# Patient Record
Sex: Male | Born: 1950 | Race: White | Hispanic: No | Marital: Married | Smoking: Never smoker
Health system: Southern US, Community
[De-identification: ages and names within clinical notes are randomized; demographics above are authoritative.]

## PROBLEM LIST (undated history)

## (undated) DIAGNOSIS — R519 Headache, unspecified: Secondary | ICD-10-CM

## (undated) DIAGNOSIS — I10 Essential (primary) hypertension: Secondary | ICD-10-CM

## (undated) DIAGNOSIS — I639 Cerebral infarction, unspecified: Secondary | ICD-10-CM

## (undated) DIAGNOSIS — M1990 Unspecified osteoarthritis, unspecified site: Secondary | ICD-10-CM

## (undated) DIAGNOSIS — M5136 Other intervertebral disc degeneration, lumbar region: Secondary | ICD-10-CM

## (undated) DIAGNOSIS — R51 Headache: Secondary | ICD-10-CM

## (undated) DIAGNOSIS — R011 Cardiac murmur, unspecified: Secondary | ICD-10-CM

## (undated) DIAGNOSIS — K219 Gastro-esophageal reflux disease without esophagitis: Secondary | ICD-10-CM

## (undated) DIAGNOSIS — M51369 Other intervertebral disc degeneration, lumbar region without mention of lumbar back pain or lower extremity pain: Secondary | ICD-10-CM

## (undated) DIAGNOSIS — E785 Hyperlipidemia, unspecified: Secondary | ICD-10-CM

## (undated) HISTORY — PX: ROTATOR CUFF REPAIR: SHX139

## (undated) HISTORY — PX: COLONOSCOPY: SHX174

## (undated) HISTORY — PX: KNEE ARTHROSCOPY: SUR90

---

## 2005-09-12 ENCOUNTER — Emergency Department: Payer: Self-pay | Admitting: General Practice

## 2015-04-19 ENCOUNTER — Other Ambulatory Visit: Payer: Self-pay | Admitting: Specialist

## 2015-04-19 DIAGNOSIS — M7551 Bursitis of right shoulder: Secondary | ICD-10-CM

## 2015-05-11 ENCOUNTER — Ambulatory Visit
Admission: RE | Admit: 2015-05-11 | Discharge: 2015-05-11 | Disposition: A | Discharge status: Home / Self Care | Payer: 59 | Source: Ambulatory Visit | Attending: Specialist | Admitting: Specialist

## 2015-05-11 DIAGNOSIS — M25511 Pain in right shoulder: Secondary | ICD-10-CM | POA: Insufficient documentation

## 2015-05-11 DIAGNOSIS — M75121 Complete rotator cuff tear or rupture of right shoulder, not specified as traumatic: Secondary | ICD-10-CM | POA: Diagnosis not present

## 2015-05-11 DIAGNOSIS — M67813 Other specified disorders of tendon, right shoulder: Secondary | ICD-10-CM | POA: Insufficient documentation

## 2015-05-11 DIAGNOSIS — M7551 Bursitis of right shoulder: Secondary | ICD-10-CM | POA: Diagnosis present

## 2015-05-19 ENCOUNTER — Other Ambulatory Visit: Payer: Self-pay | Admitting: Orthopedic Surgery

## 2015-05-24 ENCOUNTER — Encounter: Payer: Self-pay | Admitting: *Deleted

## 2015-05-24 ENCOUNTER — Other Ambulatory Visit: Payer: 59

## 2015-05-24 NOTE — Patient Instructions (Signed)
  Your procedure is scheduled on: 05-31-15 [facility]) Report to [facility] SAME DAY SURGERY 2ND FLOOR To find out your arrival time please call [phone] between 1PM - 3PM on 05-30-15 (TUESDAY)  Remember: Instructions that are not followed completely may result in serious medical risk, up to and including death, or upon the discretion of your surgeon and anesthesiologist your surgery may need to be rescheduled.    _X___ 1. Do not eat food or drink liquids after midnight. No gum chewing or hard candies.     _X___ 2. No Alcohol for 24 hours before or after surgery.   ____ 3. Bring all medications with you on the day of surgery if instructed.    _X___ 4. Notify your doctor if there is any change in your medical condition     (cold, fever, infections).     Do not wear jewelry, make-up, hairpins, clips or nail polish.  Do not wear lotions, powders, or perfumes. You may wear deodorant.  Do not shave 48 hours prior to surgery. Men may shave face and neck.  Do not bring valuables to the hospital.    [facility] is not responsible for any belongings or valuables.               Contacts, dentures or bridgework may not be worn into surgery.  Leave your suitcase in the car. After surgery it may be brought to your room.  For patients admitted to the hospital, discharge time is determined by your treatment team.   Patients discharged the day of surgery will not be allowed to drive home.   Please read over the following fact sheets that you were given:     _X___ Take these medicines the morning of surgery with A SIP OF WATER:    1. PRAVASTATIN  2. OMEPRAZOLE  3. TAKE AN EXTRA OMEPRAZOLE ON Tuesday NIGHT BEFORE BED  4.  5.  6.  ____ Fleet Enema (as directed)   _X___ Use CHG Soap as directed  ____ Use inhalers on the day of surgery  ____ Stop metformin 2 days prior to surgery    ____ Take 1/2 of usual insulin dose the night before surgery and none on the morning of surgery.   ____  Stop Coumadin/Plavix/aspirin-N/A  ____ Stop Anti-inflammatories-NO NSAIDS OR ASPIRIN PRODUCTS-NORCO OK TO CONTINUE   __X__ Stop supplements until after surgery-STOP GLUCOSAMINE NOW    ____ Bring C-Pap to the hospital.

## 2015-05-25 ENCOUNTER — Encounter
Admission: RE | Admit: 2015-05-25 | Discharge: 2015-05-25 | Disposition: A | Discharge status: Home / Self Care | Payer: 59 | Source: Ambulatory Visit | Attending: Orthopedic Surgery | Admitting: Orthopedic Surgery

## 2015-05-25 DIAGNOSIS — Z01812 Encounter for preprocedural laboratory examination: Secondary | ICD-10-CM | POA: Diagnosis present

## 2015-05-25 DIAGNOSIS — Z01810 Encounter for preprocedural cardiovascular examination: Secondary | ICD-10-CM | POA: Diagnosis present

## 2015-05-25 LAB — CBC WITH DIFFERENTIAL/PLATELET
BASOS PCT: 1 %
Basophils Absolute: 0 10*3/uL (ref 0–0.1)
Eosinophils Absolute: 0.1 10*3/uL (ref 0–0.7)
Eosinophils Relative: 3 %
HEMATOCRIT: 42.7 % (ref 40.0–52.0)
HEMOGLOBIN: 14.5 g/dL (ref 13.0–18.0)
Lymphocytes Relative: 32 %
Lymphs Abs: 1.4 10*3/uL (ref 1.0–3.6)
MCH: 30.7 pg (ref 26.0–34.0)
MCHC: 34 g/dL (ref 32.0–36.0)
MCV: 90.4 fL (ref 80.0–100.0)
MONOS PCT: 8 %
Monocytes Absolute: 0.4 10*3/uL (ref 0.2–1.0)
NEUTROS ABS: 2.5 10*3/uL (ref 1.4–6.5)
NEUTROS PCT: 56 %
Platelets: 176 10*3/uL (ref 150–440)
RBC: 4.72 MIL/uL (ref 4.40–5.90)
RDW: 12.9 % (ref 11.5–14.5)
WBC: 4.5 10*3/uL (ref 3.8–10.6)

## 2015-05-25 LAB — BASIC METABOLIC PANEL
ANION GAP: 7 (ref 5–15)
BUN: 21 mg/dL — ABNORMAL HIGH (ref 6–20)
CALCIUM: 9 mg/dL (ref 8.9–10.3)
CHLORIDE: 104 mmol/L (ref 101–111)
CO2: 28 mmol/L (ref 22–32)
Creatinine, Ser: 0.84 mg/dL (ref 0.61–1.24)
GFR calc non Af Amer: >60 mL/min (ref >60)
Glucose, Bld: 94 mg/dL (ref 65–99)
Potassium: 4.1 mmol/L (ref 3.5–5.1)
SODIUM: 139 mmol/L (ref 135–145)

## 2015-05-25 LAB — PROTIME-INR
INR: 1
PROTHROMBIN TIME: 13.4 s (ref 11.4–15.0)

## 2015-05-25 LAB — APTT: aPTT: 29 seconds (ref 24–36)

## 2015-05-31 ENCOUNTER — Encounter: Payer: Self-pay | Admitting: *Deleted

## 2015-05-31 ENCOUNTER — Encounter
Admission: RE | Disposition: A | Discharge status: Home / Self Care | Payer: Self-pay | Source: Ambulatory Visit | Attending: Orthopedic Surgery

## 2015-05-31 ENCOUNTER — Ambulatory Visit
Admission: RE | Admit: 2015-05-31 | Discharge: 2015-05-31 | Disposition: A | Discharge status: Home / Self Care | Payer: 59 | Source: Ambulatory Visit | Attending: Orthopedic Surgery | Admitting: Orthopedic Surgery

## 2015-05-31 ENCOUNTER — Ambulatory Visit: Payer: 59 | Admitting: Anesthesiology

## 2015-05-31 DIAGNOSIS — M7541 Impingement syndrome of right shoulder: Secondary | ICD-10-CM | POA: Insufficient documentation

## 2015-05-31 DIAGNOSIS — K219 Gastro-esophageal reflux disease without esophagitis: Secondary | ICD-10-CM | POA: Diagnosis not present

## 2015-05-31 DIAGNOSIS — M19011 Primary osteoarthritis, right shoulder: Secondary | ICD-10-CM | POA: Insufficient documentation

## 2015-05-31 DIAGNOSIS — M75121 Complete rotator cuff tear or rupture of right shoulder, not specified as traumatic: Secondary | ICD-10-CM | POA: Diagnosis present

## 2015-05-31 DIAGNOSIS — Z79899 Other long term (current) drug therapy: Secondary | ICD-10-CM | POA: Insufficient documentation

## 2015-05-31 DIAGNOSIS — R011 Cardiac murmur, unspecified: Secondary | ICD-10-CM | POA: Insufficient documentation

## 2015-05-31 DIAGNOSIS — M19049 Primary osteoarthritis, unspecified hand: Secondary | ICD-10-CM | POA: Diagnosis not present

## 2015-05-31 HISTORY — DX: Cardiac murmur, unspecified: R01.1

## 2015-05-31 HISTORY — DX: Gastro-esophageal reflux disease without esophagitis: K21.9

## 2015-05-31 HISTORY — DX: Unspecified osteoarthritis, unspecified site: M19.90

## 2015-05-31 HISTORY — PX: SHOULDER ARTHROSCOPY WITH OPEN ROTATOR CUFF REPAIR: SHX6092

## 2015-05-31 SURGERY — ARTHROSCOPY, SHOULDER WITH REPAIR, ROTATOR CUFF, OPEN
Anesthesia: Regional | Laterality: Right

## 2015-05-31 MED ORDER — CEFAZOLIN SODIUM-DEXTROSE 2-3 GM-% IV SOLR
INTRAVENOUS | Status: AC
  Filled 2015-05-31: qty 50

## 2015-05-31 MED ORDER — NEOMYCIN-POLYMYXIN B GU 40-200000 IR SOLN
Status: AC
  Filled 2015-05-31: qty 2

## 2015-05-31 MED ORDER — BUPIVACAINE HCL (PF) 0.25 % IJ SOLN
INTRAMUSCULAR | Status: AC
  Filled 2015-05-31: qty 30

## 2015-05-31 MED ORDER — LIDOCAINE HCL (PF) 1 % IJ SOLN
INTRAMUSCULAR | Status: AC
  Filled 2015-05-31: qty 30

## 2015-05-31 MED ORDER — MIDAZOLAM HCL 5 MG/5ML IJ SOLN
INTRAMUSCULAR | Status: DC | PRN
  Administered 2015-05-31: 2 mg via INTRAVENOUS

## 2015-05-31 MED ORDER — EPINEPHRINE HCL 1 MG/ML IJ SOLN
INTRAMUSCULAR | Status: AC
  Filled 2015-05-31: qty 1

## 2015-05-31 MED ORDER — LIDOCAINE HCL (CARDIAC) 20 MG/ML IV SOLN
INTRAVENOUS | Status: DC | PRN
  Administered 2015-05-31: 80 mg via INTRAVENOUS

## 2015-05-31 MED ORDER — BUPIVACAINE HCL (PF) 0.5 % IJ SOLN
INTRAMUSCULAR | Status: DC | PRN
  Administered 2015-05-31: 30 mL via PERINEURAL

## 2015-05-31 MED ORDER — ONDANSETRON HCL 4 MG PO TABS: 4.0000 mg | ORAL_TABLET | Freq: Three times a day (TID) | ORAL | Status: DC | PRN

## 2015-05-31 MED ORDER — CHLORHEXIDINE GLUCONATE 4 % EX LIQD
1.0000 "application " | Freq: Once | CUTANEOUS | Status: AC
  Administered 2015-05-31: 1 via TOPICAL

## 2015-05-31 MED ORDER — ONDANSETRON HCL 4 MG/2ML IJ SOLN
INTRAMUSCULAR | Status: DC | PRN
  Administered 2015-05-31: 4 mg via INTRAVENOUS

## 2015-05-31 MED ORDER — EPHEDRINE SULFATE 50 MG/ML IJ SOLN
INTRAMUSCULAR | Status: DC | PRN
  Administered 2015-05-31: 10 mg via INTRAVENOUS

## 2015-05-31 MED ORDER — FENTANYL CITRATE (PF) 100 MCG/2ML IJ SOLN: 25.0000 ug | INTRAMUSCULAR | Status: DC | PRN

## 2015-05-31 MED ORDER — CEFAZOLIN SODIUM-DEXTROSE 2-3 GM-% IV SOLR
2.0000 g | INTRAVENOUS | Status: AC
  Administered 2015-05-31: 2 g via INTRAVENOUS

## 2015-05-31 MED ORDER — ROPIVACAINE HCL 5 MG/ML IJ SOLN
INTRAMUSCULAR | Status: AC
  Filled 2015-05-31: qty 40

## 2015-05-31 MED ORDER — ROCURONIUM BROMIDE 100 MG/10ML IV SOLN
INTRAVENOUS | Status: DC | PRN
  Administered 2015-05-31: 10 mg via INTRAVENOUS

## 2015-05-31 MED ORDER — LACTATED RINGERS IV SOLN
INTRAVENOUS | Status: DC
  Administered 2015-05-31 (×2): via INTRAVENOUS

## 2015-05-31 MED ORDER — OXYCODONE HCL 5 MG PO TABS: 5.0000 mg | ORAL_TABLET | ORAL | Status: DC | PRN

## 2015-05-31 MED ORDER — PROPOFOL 10 MG/ML IV BOLUS
INTRAVENOUS | Status: DC | PRN
  Administered 2015-05-31: 150 mg via INTRAVENOUS

## 2015-05-31 MED ORDER — LIDOCAINE HCL 1 % IJ SOLN
INTRAMUSCULAR | Status: DC | PRN
  Administered 2015-05-31: 10 mL

## 2015-05-31 MED ORDER — FENTANYL CITRATE (PF) 250 MCG/5ML IJ SOLN
INTRAMUSCULAR | Status: DC | PRN
  Administered 2015-05-31 (×4): 50 ug via INTRAVENOUS

## 2015-05-31 MED ORDER — PROMETHAZINE HCL 25 MG/ML IJ SOLN: 6.2500 mg | INTRAMUSCULAR | Status: DC | PRN

## 2015-05-31 MED ORDER — SUCCINYLCHOLINE CHLORIDE 20 MG/ML IJ SOLN
INTRAMUSCULAR | Status: DC | PRN
  Administered 2015-05-31: 100 mg via INTRAVENOUS

## 2015-05-31 MED ORDER — CHLORHEXIDINE GLUCONATE 4 % EX LIQD
1.0000 "application " | Freq: Once | CUTANEOUS | Status: AC
  Administered 2015-05-30: 1 via TOPICAL

## 2015-05-31 MED ORDER — BUPIVACAINE HCL 0.25 % IJ SOLN
INTRAMUSCULAR | Status: DC | PRN
  Administered 2015-05-31: 30 mL

## 2015-05-31 SURGICAL SUPPLY — 64 items
ADAPTER IRRIG TUBE 2 SPIKE SOL (ADAPTER) ×6 IMPLANT
ANCHOR ALL-SUT Q-FIX 2.8 (Anchor) ×6 IMPLANT
BUR RADIUS 4.0X18.5 (BURR) ×3 IMPLANT
BUR RADIUS 5.5 (BURR) ×3 IMPLANT
CANNULA 5.75X7 CRYSTAL CLEAR (CANNULA) ×6 IMPLANT
CANNULA PARTIAL THREAD 2X7 (CANNULA) ×3 IMPLANT
CANNULA TWIST IN 8.25X9CM (CANNULA) ×6 IMPLANT
CLOSURE WOUND 1/2 X4 (GAUZE/BANDAGES/DRESSINGS) ×1
CONNECTOR M SMARTSTITCH (Connector) ×3 IMPLANT
COOLER POLAR GLACIER W/PUMP (MISCELLANEOUS) ×3 IMPLANT
DRAPE IMP U-DRAPE 54X76 (DRAPES) ×6 IMPLANT
DRAPE INCISE IOBAN 66X45 STRL (DRAPES) ×3 IMPLANT
DRAPE U-SHAPE 47X51 STRL (DRAPES) ×3 IMPLANT
DURAPREP 26ML APPLICATOR (WOUND CARE) ×9 IMPLANT
ELECT REM PT RETURN 9FT ADLT (ELECTROSURGICAL) ×3
ELECTRODE REM PT RTRN 9FT ADLT (ELECTROSURGICAL) ×1 IMPLANT
GAUZE PETRO XEROFOAM 1X8 (MISCELLANEOUS) ×3 IMPLANT
GAUZE SPONGE 4X4 12PLY STRL (GAUZE/BANDAGES/DRESSINGS) ×6 IMPLANT
GLOVE BIOGEL PI IND STRL 9 (GLOVE) ×1 IMPLANT
GLOVE BIOGEL PI INDICATOR 9 (GLOVE) ×2
GLOVE SURG 9.0 ORTHO LTXF (GLOVE) ×6 IMPLANT
GOWN STRL REUS TWL 2XL XL LVL4 (GOWN DISPOSABLE) ×3 IMPLANT
GOWN STRL REUS W/ TWL LRG LVL3 (GOWN DISPOSABLE) ×1 IMPLANT
GOWN STRL REUS W/ TWL LRG LVL4 (GOWN DISPOSABLE) ×1 IMPLANT
GOWN STRL REUS W/TWL LRG LVL3 (GOWN DISPOSABLE) ×2
GOWN STRL REUS W/TWL LRG LVL4 (GOWN DISPOSABLE) ×2
IV LACTATED RINGER IRRG 3000ML (IV SOLUTION) ×20
IV LR IRRIG 3000ML ARTHROMATIC (IV SOLUTION) ×10 IMPLANT
KIT RM TURNOVER STRD PROC AR (KITS) ×3 IMPLANT
KIT STABILIZATION SHOULDER (MISCELLANEOUS) ×3 IMPLANT
KIT SUTURE 2.8 Q-FIX DISP (MISCELLANEOUS) ×3 IMPLANT
MANIFOLD NEPTUNE II (INSTRUMENTS) ×3 IMPLANT
MASK FACE SPIDER DISP (MASK) ×3 IMPLANT
MAT BLUE FLOOR 46X72 FLO (MISCELLANEOUS) ×3 IMPLANT
NDL SAFETY 18GX1.5 (NEEDLE) ×3 IMPLANT
NDL SAFETY 22GX1.5 (NEEDLE) ×3 IMPLANT
NS IRRIG 500ML POUR BTL (IV SOLUTION) ×3 IMPLANT
PACK ARTHROSCOPY SHOULDER (MISCELLANEOUS) ×3 IMPLANT
PAD WRAPON POLAR SHDR UNIV (MISCELLANEOUS) ×1 IMPLANT
PASSER SUT CAPTURE FIRST (SUTURE) ×6 IMPLANT
SET TUBE SUCT SHAVER OUTFL 24K (TUBING) ×3 IMPLANT
SET TUBE TIP INTRA-ARTICULAR (MISCELLANEOUS) ×3 IMPLANT
STRIP CLOSURE SKIN 1/2X4 (GAUZE/BANDAGES/DRESSINGS) ×2 IMPLANT
SUT CO BRAID (SUTURE) ×3 IMPLANT
SUT ETHILON 4-0 (SUTURE) ×2
SUT ETHILON 4-0 FS2 18XMFL BLK (SUTURE) ×1
SUT KNTLS 2.8 MAGNUM (Anchor) ×9 IMPLANT
SUT MNCRL 4-0 (SUTURE) ×2
SUT MNCRL 4-0 27XMFL (SUTURE) ×1
SUT PDS AB 0 CT1 27 (SUTURE) ×6 IMPLANT
SUT SMART STITCH CARTRIDGE (SUTURE) ×6 IMPLANT
SUT VIC AB 0 CT1 36 (SUTURE) ×6 IMPLANT
SUT VIC AB 2-0 CT2 27 (SUTURE) ×6 IMPLANT
SUTURE ETHLN 4-0 FS2 18XMF BLK (SUTURE) ×1 IMPLANT
SUTURE MAGNUM WIRE 2X48 BLK (SUTURE) ×6 IMPLANT
SUTURE MNCRL 4-0 27XMF (SUTURE) ×1 IMPLANT
SUTURE OPUS MAGNUM SZ 2 WHT (SUTURE) ×18 IMPLANT
SYRINGE 10CC LL (SYRINGE) ×3 IMPLANT
TAPE MICROFOAM 4IN (TAPE) ×3 IMPLANT
TUBING ARTHRO INFLOW-ONLY STRL (TUBING) ×3 IMPLANT
TUBING CONNECTING 10 (TUBING) ×2 IMPLANT
TUBING CONNECTING 10' (TUBING) ×1
WAND HAND CNTRL MULTIVAC 90 (MISCELLANEOUS) ×3 IMPLANT
WRAPON POLAR PAD SHDR UNIV (MISCELLANEOUS) ×3

## 2015-05-31 NOTE — H&P (Signed)
The patient has been re-examined, and the chart reviewed, and there have been no interval changes to the documented history and physical.    The risks, benefits, and alternatives have been discussed at length, and the patient is willing to proceed.   

## 2015-05-31 NOTE — Transfer of Care (Signed)
Immediate Anesthesia Transfer of Care Note  Patient: Guy Michael  Procedure(s) Performed: Procedure(s): SHOULDER ARTHROSCOPY WITH OPEN ROTATOR CUFF REPAIR (Right)  Patient Location: PACU  Anesthesia Type:GA combined with regional for post-op pain  Level of Consciousness: awake and alert   Airway & Oxygen Therapy: Patient Spontanous Breathing  Post-op Assessment: Report given to RN  Post vital signs: Reviewed  Last Vitals:  Filed Vitals:   05/31/15 1217 05/31/15 1721  BP: 159/93 108/74  Pulse: 71 69  Temp: 36.5 C 36.7 C  Resp: 20 16    Complications: No apparent anesthesia complications

## 2015-05-31 NOTE — Op Note (Signed)
05/31/2015  5:40 PM  PATIENT:  Guy Michael  65 y.o. male  PRE-OPERATIVE DIAGNOSIS:  Complete rotator cuff tear or rupture of right shoulder, acromioclavicular arthritis  POST-OPERATIVE DIAGNOSIS:  Complete rotator cuff tear or rupture of right shoulder, partial biceps tendon tear, shoulder impingement, and acromioclavicular arthritis,   PROCEDURE:  Procedure(s): SHOULDER ARTHROSCOPY WITH OPEN ROTATOR CUFF REPAIR (Right)  SURGEON:  Surgeon(s) and Role:    * Thornton Park, MD - Primary  ANESTHESIA:   local, regional and general   PREOPERATIVE INDICATIONS:  Guy Michael is a  65 y.o. male with a diagnosis of Complete rotator cuff tear or rupture of right shoulder,  who failed conservative measures and elected for surgical management.    The risks benefits and alternatives were discussed with the patient preoperatively including but not limited to the risks of infection, bleeding, nerve injury, persistent pain or weakness, failure of the hardware, re-tear of the rotator cuff and the need for further surgery. Medical risks include DVT and pulmonary embolism, myocardial infarction, stroke, pneumonia, respiratory failure and death. Patient understood these risks and wished to proceed.  OPERATIVE IMPLANTS: ArthroCare Magnum 2 anchors x 3 & Smith [employer]hew Q Fix anchors x 2  OPERATIVE PROCEDURE: The patient was met in the preoperative area. The right shoulder was signed with the word yes and my initials according the hospital's correct site of surgery protocol. The patient is brought to the OR and underwent placement of a interscalene block and general endotracheal intubation by the anesthesia service.  The patient was placed in a beachchair position. A spider arm positioner was used for this case. Examination under anesthesia revealed limitation of motion including forward elevation to 140 and abduction to approximately 100, but no instability with load shift testing. The patient had  a negative sulcus sign.  The patient was prepped and draped in a sterile fashion. A timeout was performed to verify the patient's name, date of birth, medical record number, correct site of surgery and correct procedure to be performed there was also used to verify the patient received antibiotics that all appropriate instruments, implants and radiographs studies were available in the room. Once all in attendance were in agreement case began.  Bony landmarks were drawn out with a surgical marker along with proposed arthroscopy incisions. These were pre-injected with 1% lidocaine plain. An 11 blade was used to establish a posterior portal through which the arthroscope was placed in the glenohumeral joint. A full diagnostic examination of the shoulder was performed. The anterior portal was established under direct visualization with an 18-gauge spinal needle.  A 5.75 mm arthroscopic cannula was placed through the anterior portal.   The intra-articular portion of the biceps tendon was found to have a partial tear involving greater than 50% of the diameter. Therefore the decision was made to perform a tenotomy. An arthroscopic scissor was used to release the biceps tendon off the superior labrum. The arthroscopic shaver was then used to debride the frayed edges of the labrum. There were no anterior or superior labral tears seen.  Subscapularis tendon was intact. Patient had a full-thickness tear involving the supraspinatus with retraction to the top of the humeral head.. There were no loose bodies within the inferior recess and no evidence of HAGL lesion.  The arthroscope was then placed in the subacromial space. A lateral portal was then established using an 18-gauge spinal needle for localization.  Two ArthroCare Smart stitches were placed along the lateral border of the torn rotator cuff  under direct visualization. The greater tuberosity was debrided using a 4.0 mm resector shaver blade to remove all  remaining fibers of the rotator cuff.  Debridement was performed until punctate bleeding was seen at the greater tuberosity footprint, which will allow for rotator cuff healing.  Extensive bursitis was encountered and debrided using a 4-0 resector shaver blade and a 90 ArthroCare wand from the lateral portal. A subacromial decompression was also performed using a 5.5 mm resector shaver blade from the lateral portal and a distal clavicle excision was performed through the anterior portal also with the 5.5 mm resector All arthroscopic instruments were then removed after lavaging the subacromial space of all bone debris and the mini-open portion of the procedure began.   A saber-type incision was made along the lateral border of the acromion. The deltoid muscle was identified and split in line with its fibers which allowed visualization of the rotator cuff. The Smart stitches previously placed in the lateral border of the rotator cuff werealso brought out through the deltoid split. Two Q-Fix anchors were then placed at the articular margin of the humeral head and greater tuberosity. The four suture limbs of both Q Fix anchors were passed medially through the rotator cuff using a first pass suture passer. The Smart stitches from the lateral border of the rotator cuff were then anchored to the greater tuberosity of the humeral head using three Magnum 2 anchors. These anchors were tensioned to allow for anatomic reduction of the rotator cuff to the greater tuberosity footprint. The medial row repair was then completed using an arthroscopic knot tying technique with the Q fix anchor sutures. Once all sutures were tied down, arthroscopic images of the double row repair were taken with the arthroscope both externally and arthroscopically fromthe glenohumeral joint.  All incisions were copiously irrigated. The deltoid fascia was repaired using a 0 Vicryl suture in an interrupted fashion. The subcutaneous  tissue of all incisions were closed with a 2-0 Vicryl. Skin closure for the arthroscopic incisions was performed with 4-0 nylon. The skin edges of the saber incision were approximated with a running 4-0 undyed Monocryl. A dry sterile dressing was applied. The patient was placed in an abduction sling, with a Polar Care sleeve, and TENS unit leads were applied.  .  All sharp and instrument counts were correct at the conclusion of the case. I was scrubbed and present for the entire case. I spoke with the patient's wife in the postoperative consultation room to let them know the case had been performed without complication and the patient was stable in recovery room.     Timoteo Gaul, MD

## 2015-05-31 NOTE — Anesthesia Postprocedure Evaluation (Signed)
Anesthesia Post Note  Patient: Guy Michael  Procedure(s) Performed: Procedure(s) (LRB): SHOULDER ARTHROSCOPY WITH OPEN ROTATOR CUFF REPAIR (Right)  Patient location during evaluation: PACU Anesthesia Type: General Level of consciousness: awake Pain management: satisfactory to patient Vital Signs Assessment: post-procedure vital signs reviewed and stable Respiratory status: nonlabored ventilation Cardiovascular status: stable Anesthetic complications: no    Last Vitals:  Filed Vitals:   05/31/15 1217 05/31/15 1721  BP: 159/93 108/74  Pulse: 71 69  Temp: 36.5 C 36.7 C  Resp: 20 16    Last Pain:  Filed Vitals:   05/31/15 1729  PainSc: Asleep                 VAN STAVEREN,Wilkie Zenon

## 2015-05-31 NOTE — Anesthesia Procedure Notes (Addendum)
Anesthesia Regional Block:  Interscalene brachial plexus block  Pre-Anesthetic Checklist: ,, timeout performed, Correct Patient, Correct Site, Correct Laterality, Correct Procedure, Correct Position, site marked, Risks and benefits discussed,  Surgical consent,  Pre-op evaluation,  At surgeon's request and post-op pain management  Laterality: Right and Upper  Prep: chloraprep       Needles:  Injection technique: Single-shot  Needle Type: Stimiplex     Needle Length: 13cm 13 cm Needle Gauge: 22 and 22 G    Additional Needles:  Procedures: ultrasound guided (picture in chart) Interscalene brachial plexus block Narrative:  Start time: 05/31/2015 1:55 PM End time: 05/31/2015 2:00 PM Injection made incrementally with aspirations every 5 mL.  Performed by: Personally  Anesthesiologist: Lenard Simmer   Procedure Name: Intubation Date/Time: 05/31/2015 2:04 PM Performed by: Chong Sicilian Pre-anesthesia Checklist: Patient identified, Emergency Drugs available, Suction available, Patient being monitored and Timeout performed Patient Re-evaluated:Patient Re-evaluated prior to inductionOxygen Delivery Method: Circle system utilized Preoxygenation: Pre-oxygenation with 100% oxygen Intubation Type: IV induction Ventilation: Mask ventilation without difficulty Laryngoscope Size: Miller and 2 Grade View: Grade I Tube type: Oral Number of attempts: 1 Airway Equipment and Method: Stylet Placement Confirmation: ETT inserted through vocal cords under direct vision,  positive ETCO2 and breath sounds checked- equal and bilateral Secured at: 21 cm Tube secured with: Tape Dental Injury: Teeth and Oropharynx as per pre-operative assessment

## 2015-05-31 NOTE — Discharge Instructions (Signed)
AMBULATORY SURGERY  °DISCHARGE INSTRUCTIONS ° ° °1) The drugs that you were given will stay in your system until tomorrow so for the next 24 hours you should not: ° °A) Drive an automobile °B) Make any legal decisions °C) Drink any alcoholic beverage ° ° °2) You may resume regular meals tomorrow.  Today it is better to start with liquids and gradually work up to solid foods. ° °You may eat anything you prefer, but it is better to start with liquids, then soup and crackers, and gradually work up to solid foods. ° ° °3) Please notify your doctor immediately if you have any unusual bleeding, trouble breathing, redness and pain at the surgery site, drainage, fever, or pain not relieved by medication. ° °4) Your post-operative visit with Dr.                     °           °     is: Date:                        Time:   ° °Please call to schedule your post-operative visit. ° °5) Additional Instructions: °6)  °

## 2015-05-31 NOTE — Anesthesia Preprocedure Evaluation (Signed)
Anesthesia Evaluation  Patient identified by MRN, date of birth, ID band Patient awake    Reviewed: Allergy & Precautions, H&P , NPO status , Patient's Chart, lab work & pertinent test results, reviewed documented beta blocker date and time   History of Anesthesia Complications Negative for: history of anesthetic complications  Airway Mallampati: II  TM Distance: >3 FB Neck ROM: full    Dental no notable dental hx. (+) Teeth Intact   Pulmonary neg pulmonary ROS,    Pulmonary exam normal breath sounds clear to auscultation       Cardiovascular Exercise Tolerance: Good (-) angina(-) CAD, (-) Past MI, (-) Cardiac Stents and (-) CABG Normal cardiovascular exam(-) dysrhythmias + Valvular Problems/Murmurs (as a child)  Rhythm:regular Rate:Normal     Neuro/Psych negative neurological ROS  negative psych ROS   GI/Hepatic Neg liver ROS, GERD  Medicated,  Endo/Other  negative endocrine ROS  Renal/GU negative Renal ROS  negative genitourinary   Musculoskeletal   Abdominal   Peds  Hematology negative hematology ROS (+)   Anesthesia Other Findings Past Medical History:   Heart murmur                                                   Comment:as a child   GERD (gastroesophageal reflux disease)                       Arthritis                                                    Reproductive/Obstetrics negative OB ROS                             Anesthesia Physical Anesthesia Plan  ASA: II  Anesthesia Plan: General   Post-op Pain Management:    Induction:   Airway Management Planned:   Additional Equipment:   Intra-op Plan:   Post-operative Plan:   Informed Consent: I have reviewed the patients History and Physical, chart, labs and discussed the procedure including the risks, benefits and alternatives for the proposed anesthesia with the patient or authorized representative who has indicated  his/her understanding and acceptance.   Dental Advisory Given  Plan Discussed with: Anesthesiologist, CRNA and Surgeon  Anesthesia Plan Comments:         Anesthesia Quick Evaluation

## 2015-06-01 ENCOUNTER — Encounter: Payer: Self-pay | Admitting: Orthopedic Surgery

## 2015-08-25 DIAGNOSIS — I639 Cerebral infarction, unspecified: Secondary | ICD-10-CM

## 2015-08-25 HISTORY — DX: Cerebral infarction, unspecified: I63.9

## 2015-09-11 ENCOUNTER — Encounter: Payer: Self-pay | Admitting: Physical Therapy

## 2015-09-11 ENCOUNTER — Ambulatory Visit: Payer: 59 | Attending: Neurology | Admitting: Physical Therapy

## 2015-09-11 DIAGNOSIS — R2681 Unsteadiness on feet: Secondary | ICD-10-CM | POA: Diagnosis present

## 2015-09-11 DIAGNOSIS — R42 Dizziness and giddiness: Secondary | ICD-10-CM | POA: Diagnosis present

## 2015-09-11 DIAGNOSIS — M6281 Muscle weakness (generalized): Secondary | ICD-10-CM | POA: Insufficient documentation

## 2015-09-11 DIAGNOSIS — R262 Difficulty in walking, not elsewhere classified: Secondary | ICD-10-CM | POA: Insufficient documentation

## 2015-09-11 NOTE — Therapy (Signed)
[place] [facility] [address] , [place], [address] Phone: [phone]   Fax:  [phone]  Physical Therapy Evaluation  Patient Details  Name: Ardith DarkCharles Aicher MRN: [id number] Date of Birth: 11/19/1950 Referring Provider: Morene CrockerPOTTER, ZACHARY E  Encounter Date: 09/11/2015      PT End of Session - 09/11/15 1745    Visit Number 1   Number of Visits 17   Date for PT Re-Evaluation 11/06/15   Authorization Type g codes   PT Start Time 0445   PT Stop Time 0545   PT Time Calculation (min) 60 min   Equipment Utilized During Treatment Gait belt   Behavior [facility] for tasks assessed/performed      Past Medical History  Diagnosis Date  . Heart murmur     as a child  . GERD (gastroesophageal reflux disease)   . Arthritis     Past Surgical History  Procedure Laterality Date  . Rotator cuff repair    . Knee arthroscopy    . Colonoscopy    . Shoulder arthroscopy with open rotator cuff repair Right 05/31/2015    Procedure: SHOULDER ARTHROSCOPY WITH OPEN ROTATOR CUFF REPAIR;  Surgeon: Juanell FairlyKevin Krasinski, MD;  Location: [facility] ORS;  Service: Orthopedics;  Laterality: Right;    There were no vitals filed for this visit.       Subjective Assessment - 09/11/15 1654    Subjective patient has dizziness and double vision.    Pertinent History head ache, rotator cuff repair BUE,  DDD of lumbar spine, GERD, hyperlipidemai   Patient Stated Goals to not be dizzy   Currently in Pain? No/denies            [facility] Assessment - 09/11/15 0001    Assessment   Medical Diagnosis cva   Referring Provider Theora MasterOTTER, ZACHARY E   Onset Date/Surgical Date 08/25/15   Hand Dominance Right   Next MD Visit 11/25/15   Prior Therapy --  rotator cuff surgery and therapy for right shoulder   Precautions   Precautions None   Restrictions   Weight Bearing Restrictions No   Balance Screen   Has the patient fallen in the past 6 months No   Has the patient had a decrease in activity level because of a fear of falling?  Yes   Is the patient reluctant to leave their home because of a fear of falling?  No   Home [employer] information [employer] managernvironment   Living Environment Private residence   Living Arrangements Spouse/significant other   Available Help at Discharge Family   Type of Home House   Home Access Stairs to enter   Entrance Stairs-Number of Steps 6   Entrance Stairs-Rails Right;Left;Can reach both   Home Layout One level   Home Equipment [place] - single point   Prior Function   Level of Independence Independent   Vocation Retired       PAIN: no reports of pain  POSTURE: WFL, left facial droop   PROM/AROM: Decreased cervical ROM extension/rotation left and right and SB left and right   STRENGTH:  Graded on a 0-5 scale Muscle Group Left Right  Shoulder flex    Shoulder Abd    Shoulder Ext    Shoulder IR/ER    Elbow    Wrist/hand    Hip Flex 5 5  Hip Abd 5 5  Hip Add 5 5  Hip Ext 5 5  Hip IR/ER 5 5  Knee Flex 5 5  Knee Ext 5 5  Ankle DF 5 5  Ankle PF 5 5   SENSATION: numbness in left side of chin to left ear   SPECIAL TESTS:  Negative  Dix hall pike test  Left   FUNCTIONAL MOBILITY: independent   BALANCE: fair dynamic standing balance    GAIT: Patient ambulates with deviation in path without AD  OUTCOME MEASURES: TEST Outcome Interpretation  5 times sit<>stand 12 sec >67 yo, >15 sec indicates increased risk for falls  10 meter walk test  1.15               m/s <1.0 m/s indicates increased risk for falls; limited community ambulator  Timed up and Go     11.35            sec <14 sec indicates increased risk for falls  6 minute walk test                Feet 1000 feet is community ambulator                                 PT Education - 10-05-2015 1702    Education provided Yes   Education Details plan of care   Person(s) Educated Patient   Methods Explanation   Comprehension Verbalized  understanding             PT Long Term Goals - 2015/10/05 1754    PT LONG TERM GOAL #1   Title Patient will be independent in home exercise program to improve strength/mobility for better functional independence with mobility.   Time 8   Period Weeks   Status New   PT LONG TERM GOAL #2   Title Patient will decrease his dizziness during ambulation and mobility  to 25% of the current amount of dizziness.    Time 8   Period Weeks   Status New               Plan - 10-05-2015 1746    Clinical Impression Statement Patient has complaints of double vision and dizziness and spinning sensation. 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 He has decreased gait speed and decreased 6 MW test. Patient has decreased cervical ROM flex/ext/rotation and SB. Patient has negative dix-hall pike  on left side. Patient has head ache with walking that goes away after ambulation. Patient has deviated  path during ambulation .   Rehab Potential Fair   Clinical Impairments Affecting Rehab Potential decreased ROM cervical spine    PT Frequency 2x / week   PT Duration 8 weeks   PT Treatment/Interventions Balance training;Therapeutic activities;Neuromuscular re-education;Gait training   PT Next Visit Plan vestibular testing   Consulted and Agree with Plan of Care Family member/caregiver;Patient      Patient will benefit from skilled therapeutic intervention in order to improve the following deficits and impairments:  Difficulty walking, Decreased balance, Decreased activity tolerance, Decreased endurance, Abnormal gait  Visit Diagnosis: Muscle weakness (generalized)  Difficulty in walking, not elsewhere classified      G-Codes - 2015/10/05 1807    Functional Assessment Tool Used 5 x sit to stand, TUG, 10 MW, 6 MW   Functional Limitation Mobility: Walking and moving around   Mobility: Walking and Moving Around Current Status (Z6109) At least 1 percent but less than 20 percent impaired, limited or restricted    Mobility: Walking and Moving Around Goal Status (U0454) 0 percent impaired, limited  or restricted       Problem List There are no active problems to display for this patient.  Ezekiel Ina, PT, DPT [place], [place] 09/11/2015, 6:08 PM  [place] [facility] MAIN [facility] SERVICES [address] [place], [place], [address] Phone: [phone]   Fax:  [phone]  Name: Duwan Adrian MRN: [id number] Date of Birth: 30-Mar-1951

## 2015-09-12 ENCOUNTER — Encounter: Payer: Self-pay | Admitting: Physical Therapy

## 2015-09-12 ENCOUNTER — Ambulatory Visit: Payer: 59 | Admitting: Physical Therapy

## 2015-09-12 DIAGNOSIS — R2681 Unsteadiness on feet: Secondary | ICD-10-CM

## 2015-09-12 DIAGNOSIS — R262 Difficulty in walking, not elsewhere classified: Secondary | ICD-10-CM

## 2015-09-12 DIAGNOSIS — R42 Dizziness and giddiness: Secondary | ICD-10-CM

## 2015-09-12 DIAGNOSIS — M6281 Muscle weakness (generalized): Secondary | ICD-10-CM | POA: Diagnosis not present

## 2015-09-12 NOTE — Therapy (Signed)
[place] [facility] MAIN [facility] SERVICES [address] [place], [place], [address] Phone: [phone]   Fax:  [phone]  Physical Therapy Treatment  Patient Details  Name: Guy Michael MRN: [id number] Date of Birth: 1950-08-25 Referring Provider: Morene Crocker  Encounter Date: 09/12/2015      PT End of Session - 09/12/15 1414    Visit Number 2   Number of Visits 17   Date for PT Re-Evaluation 2015/12/06   Authorization Type g codes   PT Start Time 1306   PT Stop Time 1405   PT Time Calculation (min) 59 min   Equipment Utilized During Treatment Gait belt   Activity Tolerance Patient tolerated treatment well   Behavior During Therapy [facility] for tasks assessed/performed      Past Medical History  Diagnosis Date  . Heart murmur     as a child  . GERD (gastroesophageal reflux disease)   . Arthritis     Past Surgical History  Procedure Laterality Date  . Rotator cuff repair    . Knee arthroscopy    . Colonoscopy    . Shoulder arthroscopy with open rotator cuff repair Right 05/31/2015    Procedure: SHOULDER ARTHROSCOPY WITH OPEN ROTATOR CUFF REPAIR;  Surgeon: Juanell Fairly, MD;  Location: [facility] ORS;  Service: Orthopedics;  Laterality: Right;    There were no vitals filed for this visit.      Subjective Assessment - 09/12/15 1412    Subjective Patient reports that his dizziness is worse this afternoon and reports 8/10 dizziness at arrival.    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 Per MR, pt woke up and had severe dizziness, "fell to his left" and had  nausea. Pt had diplopia. Pt went to the ER for care.  Subjective history of current problem: Pt reports he was not having any dizziness symptoms prior to the vertebral artery dissection and stroke. Pt reports he is having dizziness all the time now. Pt reports numbness external left ear and "sensation of blockage- like ears need to pop" on the left ear. Pt reports that he has stopped taking the Meclizine and is only taking the Valium for the last few days. Patient states he felt the Valium helps his symptoms a little bit.  Pt states his symptoms are worse in the am and as the day progresses feels his dizziness improves. Patient has an eye patch to wear due to double vision.  Description of dizziness: vertigo, unsteadiness, lightheadedness, left aural fullness Frequency: daily  Duration: continuous Symptom nature: constant   Provocative Factors: riding in a car Easing Factors: planting his feet wide apart helps with the balance only, nothing helps with the dizziness  Progression of symptoms: better decreased nausea, decreased double vision and dizziness symptoms since onset History of similar episodes: no  Falls (yes/no): no Number of falls in past 6 months: none  Prior Functional Level: independent community ambulator, driving, lawn mowing business, independent with ADLs.   Auditory complaints (tinnitus, pain, drainage): sensation of aural fullness in left ear only denies  other  Vision (last eye exam, diplopia, recent changes): diplopia. and blurry vision  Current Symptoms: vertigo, nausea at times,lightheadedness, general unsteadiness, imbalance, veering, dizzy, oscillopsia   EXAMINATION      COORDINATION: Finger to Nose:  Normal Past Pointing:  Left     MUSCULOSKELETAL SCREEN: Cervical Spine ROM: Decreased cervical AROM L/ R rotation noted    Functional Mobility: independent transfers  Gait: patient ambulates with decreased cadence with decreased arm swing bilaterally with wide  BOS and demonstrates uneven steppage and decrease in gait speed with turning and with ambulation with head turns.  Scanning of visual environment with gait is: fair  Balance: Pt demonstrates difficulty with ambulation with head turns and turning. Pt demonstrates difficulty with narrow BOS, EC and uneven surfaces.   POSTURAL CONTROL TESTS:   Clinical Test of Sensory Interaction for Balance    (CTSIB):  CONDITION TIME STRATEGY SWAY  Eyes open, firm surface              30 sec ankle +1  Eyes closed, firm surface 20 sec  +4  Eyes open, foam surface 30 sec Ankle and hip +2  Eyes closed, foam surface 5 sec   +4    OCULOMOTOR / VESTIBULAR TESTING:  Oculomotor Exam- Room Light  Normal Abnormal Comments  Ocular Alignment  Abn Right pupil is lower than the left pupil   Ocular ROM N    Spontaneous Nystagmus N    End-Gaze Nystagmus   Right end gaze nystagmyus noted greater than left  Smooth Pursuit N    Saccades N    VOR N    VOR Cancellation N    Left Head Thrust   deferred  Right Head Thrust   deferred  Head Shaking Nystagmus   deferred   Reports left gaze vision makes his vision blurry and right gaze vision stays in focus.   FUNCTIONAL OUTCOME MEASURES:  Results Comments  DHI 64 Moderate perception of handicap; in need of intervention  ABC Scale 66.25% Falls risk; in need of intervention  DGI 19/24 Falls risk; in need of intervention        [facility] PT Assessment - 09/12/15 1334    Standardized Balance Assessment   Standardized Balance Assessment Dynamic Gait Index   Dynamic Gait Index   Level Surface Mild Impairment   Change in Gait Speed Normal   Gait with Horizontal Head Turns Moderate Impairment   Gait with Vertical Head Turns Moderate Impairment   Gait and Pivot Turn Normal   Step Over Obstacle Normal   Step Around Obstacles Normal   Steps Normal   Total Score 19      Neuromuscular Re-education:  On firm surface and Airex pad: On firm surface and then on Airex  pad, performed feet together and semi-tandem progressions with alternate lead leg with and without horiz and vert head turns with CGA. On firm surface and then on Airex pad, performed feet together and semi-tandem progressions with alternate lead leg with and without body turns with CGA. Pt with hip strategy and increase of LOB with activities on Airex pad.      PT Education - 09/12/15 1414    Education provided Yes   Education Details Issued and reviewed as to HEP including safety precautions.    Person(s) Educated Patient;Spouse   Methods Explanation;Demonstration;Handout;Verbal cues   Comprehension Verbalized understanding;Returned demonstration;Verbal cues required             PT Long Term Goals - 09/11/15 1754  PT LONG TERM GOAL #1   Title Patient will be independent in home exercise program to improve strength/mobility for better functional independence with mobility.   Time 8   Period Weeks   Status New   PT LONG TERM GOAL #2   Title Patient will decrease his dizziness during ambulation and mobility  to 25% of the current amount of dizziness.    Time 8   Period Weeks   Status New               Plan - 09/12/15 1415    Rehab Potential Fair   Clinical Impairments Affecting Rehab Potential decreased ROM cervical spine; Positive indicators: motivated, family support   Negative Indicators: vertebral artery dissection with stroke    PT Frequency 2x / week   PT Duration 8 weeks   PT Treatment/Interventions Balance training;Therapeutic activities;Neuromuscular re-education;Gait training;Patient/family education;Vestibular   PT Next Visit Plan Review HEP and added additional balance and vestibular exercises   PT Home Exercise Plan feet together and semi-tandem progressions with and without body turns and head turns on firm and foam,    Consulted and Agree with Plan of Care Family member/caregiver;Patient      Patient will benefit from skilled therapeutic  intervention in order to improve the following deficits and impairments:  Difficulty walking, Decreased balance, Decreased activity tolerance, Decreased endurance, Abnormal gait, Decreased coordination, Dizziness  Visit Diagnosis: Dizziness and giddiness  Difficulty in walking, not elsewhere classified  Unsteadiness on feet       G-Codes - 09/11/15 1807    Functional Assessment Tool Used 5 x sit to stand, TUG, 10 MW, 6 MW   Functional Limitation Mobility: Walking and moving around   Mobility: Walking and Moving Around Current Status [phone]([id number]) At least 1 percent but less than 20 percent impaired, limited or restricted   Mobility: Walking and Moving Around Goal Status [phone](G8979) 0 percent impaired, limited or restricted      Problem List There are no active problems to display for this patient.  Mardelle Matteorriea Nikesha Kwasny PT, DPT Mardelle MatteMurphy,Jhamir Pickup 09/12/2015, 2:20 PM  [place] [facility] [address] [place], [place], [address] Phone: [phone]   Fax:  [phone]  Name: Guy Michael MRN: [id number] Date of Birth: 05/14/1950

## 2015-09-14 ENCOUNTER — Ambulatory Visit: Payer: 59 | Admitting: Physical Therapy

## 2015-09-19 ENCOUNTER — Encounter: Payer: Self-pay | Admitting: Physical Therapy

## 2015-09-19 ENCOUNTER — Ambulatory Visit: Payer: 59 | Admitting: Physical Therapy

## 2015-09-19 DIAGNOSIS — R262 Difficulty in walking, not elsewhere classified: Secondary | ICD-10-CM

## 2015-09-19 DIAGNOSIS — M6281 Muscle weakness (generalized): Secondary | ICD-10-CM | POA: Diagnosis not present

## 2015-09-19 DIAGNOSIS — R42 Dizziness and giddiness: Secondary | ICD-10-CM

## 2015-09-19 NOTE — Therapy (Signed)
[place] [facility] MAIN [facility] SERVICES [address] [place], [place], [address] Phone: [phone]   Fax:  [phone]  Physical Therapy Treatment  Patient Details  Name: Guy Michael MRN: [id number] Date of Birth: 1950-12-17 Referring Provider: Morene Crocker  Encounter Date: 09/19/2015      PT End of Session - 09/19/15 1352    Visit Number 3   Number of Visits 17   Date for PT Re-Evaluation 11-17-15   Authorization Type g codes   PT Start Time 1350   PT Stop Time 1434   PT Time Calculation (min) 44 min   Equipment Utilized During Treatment Gait belt   Activity Tolerance Patient tolerated treatment well   Behavior During Therapy [facility] for tasks assessed/performed      Past Medical History  Diagnosis Date  . Heart murmur     as a child  . GERD (gastroesophageal reflux disease)   . Arthritis     Past Surgical History  Procedure Laterality Date  . Rotator cuff repair    . Knee arthroscopy    . Colonoscopy    . Shoulder arthroscopy with open rotator cuff repair Right 05/31/2015    Procedure: SHOULDER ARTHROSCOPY WITH OPEN ROTATOR CUFF REPAIR;  Surgeon: Juanell Fairly, MD;  Location: [facility] ORS;  Service: Orthopedics;  Laterality: Right;    There were no vitals filed for this visit.      Subjective Assessment - 09/19/15 1354    Subjective Pt  reports that he continues to have markedly increased dizziness when in the car and states he keeps his eyes closed when riding in cars. Patient reports that he has been trying to do his exercises twice a day and states that he is also trying do his arm exercises. Pt states he is having good and bad days in regards to his dizziness and imbalance symptoms. Pt states he is having 8/10 dizziness on arrival this date. Patient reports that during the session nothing increased his dizziness levels and that if anything doing the activities was decreasing some of his dizziness while doing the activity.      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 Per MR, pt woke up and had severe dizziness, "fell to his left" and had nausea. Pt had diplopia. Pt went to the ER for care.    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 --  none stated     Neuromuscular Re-education:  VOR exercise: Demonstrated and discussed VOR x1 exercise. In sitting, pt performed VOR X 1 horiz 1 rep of 30 seconds and then 1 rep of 1 minute. Pt reported no increase in dizziness (patient arrived to clinic with reports of 8/10 dizziness at rest this date), but states he felt like he felt a headache might be starting.   On firm surface: On firm surface performed static SLS stance holds with patient requiring 2 finger support of one hand to be able to balance longer than 2-3 seconds per leg and performed slow marching with 3-5 second holds with pt touching with few fingers of one hand for support and intermittently being able to lift up fingers for a few seconds. Patient had difficulty performing SLS activities and required CGA. Patient tends to lose his  balance to the left side.   Walking with head turns: Performed multiple 16' trials each of forwards and retro ambulation with and without vert and horiz head turns with CGA with good cadence and noted mild veering and uneven steps at times.   Quick Turns:  Performed 8 reps walking 8' with alternating quick turns L/R. Patient did well with this activity and had only one small LOB with a quick turn that he was able to self-correct.    Newman Pies toss to self: Performed static ball toss to self horiz and vert while turning head and eyes to follow ball. Then, worked on 30' trials of forward ambulation while doing ball toss to self vert and  horiz.  Hallway ball toss: In hallway, worked on ball toss against one wall with alternating quick turns to toss ball against opposite wall. Patient did well with this activity with no LOB and denied any increase in his dizziness levels with this activity.         PT Education - 09/19/15 1534    Education provided Yes   Education Details Added additional exercises to HEP; demonstrated and then patient returned demonstration of standing in corner for safety with HEP   Person(s) Educated Patient;Spouse   Methods Explanation;Demonstration;Handout   Comprehension Verbalized understanding;Returned demonstration             PT Long Term Goals - 09/12/15 1425    PT LONG TERM GOAL #1   Title Patient will be independent in home exercise program to improve strength/mobility for better functional independence with mobility.   Time 8   Period Weeks   Status New   PT LONG TERM GOAL #2   Title Patient will decrease his dizziness during ambulation and mobility  to 25% of the current amount of dizziness.    Baseline Reports 8/10 dizziness on 09/12/2015   Time 8   Period Weeks   Status New   PT LONG TERM GOAL #3   Title Patient will demonstrate reduced falls risk as evidenced by Dynamic Gait Index (DGI) 21/24 or greater.   Baseline scored 19/24 on 09/12/2015   Time 8   Period Weeks   Status New   PT LONG TERM GOAL #4   Title Patient will reduce perceived disability to low levels as indicated by <40 on Dizziness Handicap Inventory.   Baseline scored 64 moderate on DHI on 09/12/2015   Time 8   Period Weeks   Status New   PT LONG TERM GOAL #5   Title Patient will have demonstrate decreased falls risk as indicated by Activities Specific Balance Confidence Scale score of 80% or greater.   Baseline scored 66.25% on 09/12/2015   Time 8   Period Weeks   Status New               Plan - 09/19/15 1539    Clinical Impression Statement Patient demonstrates improvements with ambulation  with quick turns this date but continues to demonstrate mild veering and some uneven steppage with ambulation with head turns. Pt has marked difficulty with SLS stance activities and tends to lose his balance to his left side. Pt requiring a few fingers support of one hand in order to maintain SLS for greater than 3-5 seconds. Patient would benefit from continued PT services to work on balance and dizziness symptoms and to work towards goals as set on updated POC.   Rehab Potential Fair   Clinical Impairments Affecting Rehab Potential decreased ROM cervical spine; Positive  indicators: motivated, family support   Negative Indicators: vertebral artery dissection with stroke    PT Frequency 2x / week   PT Duration 8 weeks   PT Treatment/Interventions Balance training;Therapeutic activities;Neuromuscular re-education;Gait training;Patient/family education;Vestibular   PT Next Visit Plan Review HEP; consider working on ball circles, rockerboard sways.   PT Home Exercise Plan feet together and semi-tandem progressions with and without body turns and head turns on firm and foam; VOR, slow marching, SLS, amb with head turns, amb with quick turns;    Consulted and Agree with Plan of Care Family member/caregiver;Patient      Patient will benefit from skilled therapeutic intervention in order to improve the following deficits and impairments:  Difficulty walking, Decreased balance, Decreased activity tolerance, Decreased endurance, Abnormal gait, Decreased coordination, Dizziness  Visit Diagnosis: Dizziness and giddiness  Difficulty in walking, not elsewhere classified     Problem List There are no active problems to display for this patient.  Mardelle Matteorriea Murphy PT, DPT Mardelle MatteMurphy,Dorriea 09/19/2015, 3:56 PM  [place] [facility] [address] [place], [place], [address] Phone: [phone]   Fax:  [phone]  Name: Ardith DarkCharles Michael MRN: [id number] Date  of Birth: 01/03/1951

## 2015-09-20 ENCOUNTER — Ambulatory Visit: Payer: 59 | Admitting: Physical Therapy

## 2015-09-22 ENCOUNTER — Ambulatory Visit: Payer: 59 | Attending: Neurology | Admitting: Physical Therapy

## 2015-09-22 ENCOUNTER — Encounter: Payer: Self-pay | Admitting: Physical Therapy

## 2015-09-22 DIAGNOSIS — R262 Difficulty in walking, not elsewhere classified: Secondary | ICD-10-CM | POA: Diagnosis present

## 2015-09-22 DIAGNOSIS — R2681 Unsteadiness on feet: Secondary | ICD-10-CM | POA: Diagnosis present

## 2015-09-22 DIAGNOSIS — R42 Dizziness and giddiness: Secondary | ICD-10-CM | POA: Insufficient documentation

## 2015-09-22 DIAGNOSIS — M6281 Muscle weakness (generalized): Secondary | ICD-10-CM | POA: Insufficient documentation

## 2015-09-22 NOTE — Therapy (Signed)
[facility] [address] [place], [place], [address] Phone: [phone]   Fax:  [phone]  Physical Therapy Treatment  Patient Details  Name: Guy Michael MRN: [id number] Date of Birth: 03/07/1951 Referring Provider: Morene CrockerPOTTER, ZACHARY E  Encounter Date: 09/22/2015      PT End of Session - 09/22/15 1036    Visit Number 4   Number of Visits 17   Date for PT Re-Evaluation 11/06/15   Authorization Type g codes   PT Start Time 1031   PT Stop Time 1116   PT Time Calculation (min) 45 min   Equipment Utilized During Treatment Gait belt   Activity Tolerance Patient tolerated treatment well   Behavior [facility] for tasks assessed/performed      Past Medical History  Diagnosis Date  . Heart murmur     as a child  . GERD (gastroesophageal reflux disease)   . Arthritis     Past Surgical History  Procedure Laterality Date  . Rotator cuff repair    . Knee arthroscopy    . Colonoscopy    . Shoulder arthroscopy with open rotator cuff repair Right 05/31/2015    Procedure: SHOULDER ARTHROSCOPY WITH OPEN ROTATOR CUFF REPAIR;  Surgeon: Juanell FairlyKevin Krasinski, MD;  Location: [facility] ORS;  Service: Orthopedics;  Laterality: Right;    There were no vitals filed for this visit.      Subjective Assessment - 09/22/15 1035    Subjective Pt states he is still having up and down days and reports 8/10 dizziness on arrival this date. Pt states that now at night he is able to get up night by himself.    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 Per MR, pt woke up and had severe dizziness, "fell to his left" and had nausea. Pt had diplopia. Pt went to the ER for care.    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 Pt required verbal cues for amount of head turn excursion and to try to turn head smoothly side to side. Demonstrated and patient returned demonstrated chin tuck technique for VOR exercise.   Alphabet: Performed while in sitting drawing alphabet letters with left foot.  Rockerboard: On small wooden rocker board, worked on Home DepotSS and A/P sways with and without horiz head turns. On medium wooden rocker board, worked on Starbucks CorporationSS sways without horiz head turns.  Ball circles: Worked on standing with one foot on ball while doing CW rolls multiple reps (about 15) each foot with faded UEs support. Performed standing on firm surface and then repeated standing on Airex pad.  Step Taps: Pt performed alternate foot tapping on Airex pad about 20 reps.  Ther-ex: Quantum leg press 120# x 10, 130# 2 x 10;       PT Education - 09/22/15 1036    Education provided Yes   Education Details Added exercises to AT&THEP   Person(s) Educated Patient   Methods Explanation;Handout   Comprehension Verbalized understanding;Returned demonstration;Verbal cues required             PT Long Term Goals - 09/12/15  1425    PT LONG TERM GOAL #1   Title Patient will be independent in home exercise program to improve strength/mobility for better functional independence with mobility.   Time 8   Period Weeks   Status New   PT LONG TERM GOAL #2   Title Patient will decrease his dizziness during ambulation and mobility  to 25% of the current amount of dizziness.    Baseline Reports 8/10 dizziness on 09/12/2015   Time 8   Period Weeks   Status New   PT LONG TERM GOAL #3   Title Patient will demonstrate reduced falls risk as evidenced by Dynamic Gait Index (DGI) 21/24 or greater.   Baseline scored 19/24 on 09/12/2015   Time 8   Period Weeks   Status New   PT LONG TERM GOAL #4    Title Patient will reduce perceived disability to low levels as indicated by <40 on Dizziness Handicap Inventory.   Baseline scored 64 moderate on DHI on 09/12/2015   Time 8   Period Weeks   Status New   PT LONG TERM GOAL #5   Title Patient will have demonstrate decreased falls risk as indicated by Activities Specific Balance Confidence Scale score of 80% or greater.   Baseline scored 66.25% on 09/12/2015   Time 8   Period Weeks   Status New          Plan: Assessment: Pt reports good compliance with HEP. Pt continues to have difficulty with SLS especially when standing on left leg, uneven surfaces and pt tends to lose his balance posterolaterally to the left when balance is challenged. Encouraged patient to follow-up as indicated.      Plan - 09/22/15 1037    Rehab Potential Fair   Clinical Impairments Affecting Rehab Potential decreased ROM cervical spine; Positive indicators: motivated, family support   Negative Indicators: vertebral artery dissection with stroke    PT Frequency 2x / week   PT Duration 8 weeks   PT Treatment/Interventions Balance training;Therapeutic activities;Neuromuscular re-education;Gait training;Patient/family education;Vestibular   PT Next Visit Plan Continue working on ball circles with progressions, rockerboard sways, cone tapping, [employer] tower walking   PT Home Exercise Plan feet together and semi-tandem progressions with and without body turns and head turns on firm and foam; VOR, slow marching, SLS, amb with head turns, amb with quick turns;    Consulted and Agree with Plan of Care Family member/caregiver;Patient      Patient will benefit from skilled therapeutic intervention in order to improve the following deficits and impairments:  Difficulty walking, Decreased balance, Decreased activity tolerance, Decreased endurance, Abnormal gait, Decreased coordination, Dizziness  Visit Diagnosis: Dizziness and giddiness  Difficulty in walking, not elsewhere  classified     Problem List There are no active problems to display for this patient.  Mardelle Matte PT, DPT Murphy,Dorriea 09/22/2015, 4:40 PM  [place] [facility] MAIN [facility] SERVICES [address] [place], [place], [address] Phone: [phone]   Fax:  [phone]  Name: Guy Michael MRN: [id number] Date of Birth: [birth year]

## 2015-09-26 ENCOUNTER — Ambulatory Visit: Payer: 59 | Admitting: Physical Therapy

## 2015-09-27 ENCOUNTER — Ambulatory Visit: Payer: 59

## 2015-09-27 VITALS — BP 147/90 | HR 60

## 2015-09-27 DIAGNOSIS — R42 Dizziness and giddiness: Secondary | ICD-10-CM

## 2015-09-27 DIAGNOSIS — R2681 Unsteadiness on feet: Secondary | ICD-10-CM

## 2015-09-27 DIAGNOSIS — R262 Difficulty in walking, not elsewhere classified: Secondary | ICD-10-CM

## 2015-09-27 NOTE — Therapy (Signed)
[facility] MAIN [facility] SERVICES [address] [place], [place], [address] Phone: [phone]   Fax:  [phone]  Physical Therapy Treatment  Patient Details  Name: Guy Michael MRN: [id number] Date of Birth: 12-Feb-1951 Referring Provider: Morene Crocker  Encounter Date: 09/27/2015      PT End of Session - 09/27/15 1613    Visit Number 5   Number of Visits 17   Date for PT Re-Evaluation 11/21/2015   Authorization Type g codes   PT Start Time 0953   PT Stop Time 1040   PT Time Calculation (min) 47 min   Equipment Utilized During Treatment Gait belt   Activity Tolerance Patient tolerated treatment well   Behavior During Therapy [facility] for tasks assessed/performed      Past Medical History  Diagnosis Date  . Heart murmur     as a child  . GERD (gastroesophageal reflux disease)   . Arthritis     Past Surgical History  Procedure Laterality Date  . Rotator cuff repair    . Knee arthroscopy    . Colonoscopy    . Shoulder arthroscopy with open rotator cuff repair Right 05/31/2015    Procedure: SHOULDER ARTHROSCOPY WITH OPEN ROTATOR CUFF REPAIR;  Surgeon: Juanell Fairly, MD;  Location: [facility] ORS;  Service: Orthopedics;  Laterality: Right;    Filed Vitals:   09/27/15 0957  BP: 147/90  Pulse: 60  SpO2: 98%        Subjective Assessment - 09/27/15 0954    Subjective Pt reports 5/10 dizziness upon arrival on this date. He denies pain currently. He reports that exercises are going well at home. Pt appears frustrated by lack of improvement in dizziness. However he does state that it has improved since it first started. He has also noted improvement in his balance.    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 Per MR, pt woke up and had severe dizziness, "fell to his left" and had nausea. Pt had  diplopia. Pt went to the ER for care.    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 No/denies      TREATMENT  Neuromuscular Re-education: VOR exercise: In feet together standing on Airex pad, pt performed VOR X 1 horiz x 1 minute, pt with increased sway on Airex. Cues for proper speed with head turns. Denies worsening dizziness with exercises; VOR x 1 horizontal with forward/retro ambulation 35' x 3;  Step and Cone taps: Pt performed alternate foot tapping on Airex pad about 10 reps/leg; On Airex pad/ foam surface, patient performed left and right foot tapping to cone targets in series of one, two, and three cones, multiple reps each leg. Pt pressing arms against // bars for balance at times.  Eyes Closed Practiced feet apart and feet together balance on Airex with eyes open and then closed in each condition, 30 seconds each;  Tandem Gait Performed tandem gait in // bars x 4 lengths with intermittent min/modA+1 and pressing arms against // bars for balance;  Sidestepping Performed side stepping in // bars x 2 lengths; Side stepping with horizontal head turns x 2 lengths; Side stepping on Airex balance beam with horizontal head turns x 4 lengths;  Hallway walking Forward/backwards ambulation in hallway  with head turns to read playing cards 75' x 2 each; 180 degree quick turns alternating L and R in 10' segments, multiple repetitions  Ball circles: Worked on standing on Airex with one foot on ball while doing CW/CCW rolls multiple reps each foot without UE support;                          PT Education - 09/27/15 1612    Education provided Yes   Education Details Reinforced HEP, will progress at next visit   Person(s) Educated Patient   Methods Explanation   Comprehension Verbalized understanding             PT Long Term Goals - 09/12/15 1425    PT  LONG TERM GOAL #1   Title Patient will be independent in home exercise program to improve strength/mobility for better functional independence with mobility.   Time 8   Period Weeks   Status New   PT LONG TERM GOAL #2   Title Patient will decrease his dizziness during ambulation and mobility  to 25% of the current amount of dizziness.    Baseline Reports 8/10 dizziness on 09/12/2015   Time 8   Period Weeks   Status New   PT LONG TERM GOAL #3   Title Patient will demonstrate reduced falls risk as evidenced by Dynamic Gait Index (DGI) 21/24 or greater.   Baseline scored 19/24 on 09/12/2015   Time 8   Period Weeks   Status New   PT LONG TERM GOAL #4   Title Patient will reduce perceived disability to low levels as indicated by <40 on Dizziness Handicap Inventory.   Baseline scored 64 moderate on DHI on 09/12/2015   Time 8   Period Weeks   Status New   PT LONG TERM GOAL #5   Title Patient will have demonstrate decreased falls risk as indicated by Activities Specific Balance Confidence Scale score of 80% or greater.   Baseline scored 66.25% on 09/12/2015   Time 8   Period Weeks   Status New               Plan - 09/27/15 1616    Clinical Impression Statement Pt demonstrates decreased balance on Airex with feet together and eyes closed (condition 4 mCTSIB). He consistently falls to the L after 3-5 seconds. Pt requiring multiple steps to regain balance with quick turns in hallway but gradually improves with repetition. Still has notable L horizontal nystagmus with mid range L gaze. Pt provided encouragement regarding progress since starting therapy. Discussed prognosis and importance of agressive therapy within the first 6 months following a neurological insult. Encouraged to continue HEP. Will progress exercises at next visit if indicated.    Rehab Potential Fair   Clinical Impairments Affecting Rehab Potential decreased ROM cervical spine; Positive indicators: motivated, family  support   Negative Indicators: vertebral artery dissection with stroke    PT Frequency 2x / week   PT Duration 8 weeks   PT Treatment/Interventions Balance training;Therapeutic activities;Neuromuscular re-education;Gait training;Patient/family education;Vestibular   PT Next Visit Plan Review HEP; rockerboard sways, head turns with ambulation, eyes closed marching on firm   PT Home Exercise Plan feet together and semi-tandem progressions with and without body turns and head turns on firm and foam; VOR, slow marching, SLS, amb with head turns, amb with quick turns;    Consulted and Agree with Plan of Care Family member/caregiver;Patient      Patient  will benefit from skilled therapeutic intervention in order to improve the following deficits and impairments:  Difficulty walking, Decreased balance, Decreased activity tolerance, Decreased endurance, Abnormal gait, Decreased coordination, Dizziness  Visit Diagnosis: Dizziness and giddiness  Difficulty in walking, not elsewhere classified  Unsteadiness on feet     Problem List There are no active problems to display for this patient.  Lynnea MaizesJason D Huprich PT, DPT   Huprich,Jason 09/27/2015, 4:20 PM  [facility] [address] [place], [place], [address] Phone: [phone]   Fax:  [phone]  Name: Guy Michael MRN: [id number] Date of Birth: 01/29/1951

## 2015-09-29 ENCOUNTER — Ambulatory Visit: Payer: 59 | Admitting: Physical Therapy

## 2015-10-02 ENCOUNTER — Ambulatory Visit: Payer: 59 | Admitting: Physical Therapy

## 2015-10-04 ENCOUNTER — Encounter: Payer: Self-pay | Admitting: Physical Therapy

## 2015-10-04 ENCOUNTER — Ambulatory Visit: Payer: 59 | Admitting: Physical Therapy

## 2015-10-04 DIAGNOSIS — R42 Dizziness and giddiness: Secondary | ICD-10-CM

## 2015-10-04 DIAGNOSIS — R262 Difficulty in walking, not elsewhere classified: Secondary | ICD-10-CM

## 2015-10-04 DIAGNOSIS — R2681 Unsteadiness on feet: Secondary | ICD-10-CM

## 2015-10-04 NOTE — Therapy (Signed)
[facility] [facility] [address] [place], [place], [address] Phone: [phone]   Fax:  [phone]  Physical Therapy Treatment  Patient Details  Name: Guy Michael MRN: [id number] Date of Birth: 01/05/1951 Referring Provider: Morene CrockerPOTTER, ZACHARY E  Encounter Date: 10/04/2015      PT End of Session - 10/04/15 1409    Visit Number 6   Number of Visits 17   Date for PT Re-Evaluation 11/06/15   Authorization Type g codes   PT Start Time 1306   PT Stop Time 1352   PT Time Calculation (min) 46 min   Equipment Utilized During Treatment Gait belt   Activity Tolerance Patient tolerated treatment well   Behavior [facility] for tasks assessed/performed      Past Medical History  Diagnosis Date  . Heart murmur     as a child  . GERD (gastroesophageal reflux disease)   . Arthritis     Past Surgical History  Procedure Laterality Date  . Rotator cuff repair    . Knee arthroscopy    . Colonoscopy    . Shoulder arthroscopy with open rotator cuff repair Right 05/31/2015    Procedure: SHOULDER ARTHROSCOPY WITH OPEN ROTATOR CUFF REPAIR;  Surgeon: Juanell FairlyKevin Krasinski, MD;  Location: [facility] ORS;  Service: Orthopedics;  Laterality: Right;    There were no vitals filed for this visit.      Subjective Assessment - 10/04/15 1308    Subjective Patient states he does not feel his dizziness has changed much. Pt states that he felts he does feel that after doing his exercises he feels his balance and dizziness are a little better. Pt states he has been doing his exercises twice a day. Pt states he has been walking more and walked a mile yesterday without difficulty.    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 Per MR, pt woke up and had severe dizziness, "fell to his left" and had nausea. Pt had diplopia.  Pt went to the ER for care.    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   VOR X2: Demonstrated and discussed VOR X 2 exercise. Patient performed VOR X 2 horiz in sitting 3 reps of 1 minute each. Pt reported increased dizziness a rated 8/10. Pt demonstrated good technique.  Active eye movement between 2 Targets: Discussed and demonstrated active eye movements between 2 targets exercise. Pt performed active eye movements between two targets horiz and vert 2 reps of 60 seconds each with good technique. Performed in sitting visual scanning of letters on mirror to spell words.  On firm surface: On firm surface performed feet together EC trials and then attempted EC with slow marching but this was too difficult therefore tried EC SLS on firm surface and patient was able to hold for 2-4 seconds with reaching for // bars at times due to overt LOB.    Newman PiesBall toss over shoulder: Pt performed multiple 1775' trials of forward and retro ambulation while tossing ball over one shoulder with return catch over opposite shoulder at shoulder level and then with varying the ball position to head, shoulder and waist level to promote head turning  and tilting with CGA. Patient reports that this recreates and increased his dizziness levels. Patient with good pace with activity but noted several small LOBs and missteps.   Bounce Passes: Performed ambulation 73' trials while doing alternating sides bounce passes to self with ball while tracking ball with eyes and head.         PT Education - 10/04/15 1408    Education provided Yes   Education Details Added additional exercises to HEP and reviewed with patient   Person(s) Educated Patient;Other (comment)  friend   Methods Explanation;Demonstration;Handout;Verbal cues    Comprehension Verbalized understanding;Returned demonstration             PT Long Term Goals - 09/12/15 1425    PT LONG TERM GOAL #1   Title Patient will be independent in home exercise program to improve strength/mobility for better functional independence with mobility.   Time 8   Period Weeks   Status New   PT LONG TERM GOAL #2   Title Patient will decrease his dizziness during ambulation and mobility  to 25% of the current amount of dizziness.    Baseline Reports 8/10 dizziness on 09/12/2015   Time 8   Period Weeks   Status New   PT LONG TERM GOAL #3   Title Patient will demonstrate reduced falls risk as evidenced by Dynamic Gait Index (DGI) 21/24 or greater.   Baseline scored 19/24 on 09/12/2015   Time 8   Period Weeks   Status New   PT LONG TERM GOAL #4   Title Patient will reduce perceived disability to low levels as indicated by <40 on Dizziness Handicap Inventory.   Baseline scored 64 moderate on DHI on 09/12/2015   Time 8   Period Weeks   Status New   PT LONG TERM GOAL #5   Title Patient will have demonstrate decreased falls risk as indicated by Activities Specific Balance Confidence Scale score of 80% or greater.   Baseline scored 66.25% on 09/12/2015   Time 8   Period Weeks   Status New               Plan - 10/04/15 1410    Clinical Impression Statement Patient reports good compliance with home exercise program and is demonstrating improvements with balance activities in clinic such as EC feet together on firm. Patient continues to struggle with EC, SLS and narrow BOS activities and in general losses his balance to the left consistently when challenged. Patient reported that VOR X 2 exercise brought on an in increase in his dizziness rating and therefore added this as well as active eye movements between two targets to HEP. Will consider doing activities on the Dentist.    Rehab Potential Fair   Clinical Impairments Affecting Rehab Potential  decreased ROM cervical spine; Positive indicators: motivated, family support   Negative Indicators: vertebral artery dissection with stroke    PT Frequency 2x / week   PT Duration 8 weeks   PT Treatment/Interventions Balance training;Therapeutic activities;Neuromuscular re-education;Gait training;Patient/family education;Vestibular   PT Next Visit Plan Head turns with ambulation activities, ambulation with ball toss over shoulder forward and retro, consider trying Balance Master machine for limits of stability sways and rhythmic weight shift.    PT Home Exercise Plan feet together and semi-tandem progressions with and without body turns and head turns on firm and foam; VOR, slow marching, SLS, amb with head turns, amb with quick turns;    Consulted and Agree with Plan of Care  Family member/caregiver;Patient      Patient will benefit from skilled therapeutic intervention in order to improve the following deficits and impairments:  Difficulty walking, Decreased balance, Decreased activity tolerance, Decreased endurance, Abnormal gait, Decreased coordination, Dizziness  Visit Diagnosis: Dizziness and giddiness  Difficulty in walking, not elsewhere classified  Unsteadiness on feet     Problem List There are no active problems to display for this patient.  Mardelle Matte PT, DPT Mardelle Matte 10/04/2015, 2:16 PM  [place] [facility] MAIN [facility] SERVICES [address] [place], [place], [address] Phone: [phone]   Fax:  [phone]  Name: Judy Goodenow MRN: [id number] Date of Birth: June 04, 1950

## 2015-10-05 ENCOUNTER — Ambulatory Visit: Payer: 59 | Admitting: Physical Therapy

## 2015-10-06 ENCOUNTER — Encounter: Payer: Self-pay | Admitting: Physical Therapy

## 2015-10-06 ENCOUNTER — Ambulatory Visit: Payer: 59 | Admitting: Physical Therapy

## 2015-10-06 DIAGNOSIS — R262 Difficulty in walking, not elsewhere classified: Secondary | ICD-10-CM

## 2015-10-06 DIAGNOSIS — R42 Dizziness and giddiness: Secondary | ICD-10-CM

## 2015-10-06 NOTE — Therapy (Signed)
[place] MAIN [facility] SERVICES [address] [place], [place], [address] Phone: [phone]   Fax:  [phone]  Physical Therapy Treatment  Patient Details  Name: Guy Michael MRN: [id number] Date of Birth: [birth year] Referring Provider: Morene Crocker  Encounter Date: 10/06/2015      PT End of Session - 10/06/15 0907    Visit Number 7   Number of Visits 17   Date for PT Re-Evaluation 2015-11-07   Authorization Type g codes   PT Start Time 0825   PT Stop Time 0912   PT Time Calculation (min) 47 min   Equipment Utilized During Treatment Gait belt   Activity Tolerance Patient tolerated treatment well   Behavior During Therapy [facility] for tasks assessed/performed      Past Medical History  Diagnosis Date  . Heart murmur     as a child  . GERD (gastroesophageal reflux disease)   . Arthritis     Past Surgical History  Procedure Laterality Date  . Rotator cuff repair    . Knee arthroscopy    . Colonoscopy    . Shoulder arthroscopy with open rotator cuff repair Right 05/31/2015    Procedure: SHOULDER ARTHROSCOPY WITH OPEN ROTATOR CUFF REPAIR;  Surgeon: Juanell Fairly, MD;  Location: [facility] ORS;  Service: Orthopedics;  Laterality: Right;    There were no vitals filed for this visit.      Subjective Assessment - 10/06/15 0827    Subjective Patient reports 8/10 dizziness on arrival. Pt states he has been doing the new exercises and those have been going all right. Pt reports increase in headaches in the past few days. Pt states he was having headaches initially in the hospital but states it had gotten better.    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 Per MR, pt woke up and had severe dizziness, "fell to his left" and had nausea. Pt had diplopia. Pt went to the ER for care.    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 Yes   Pain Location Head   Pain Orientation Posterior      Neuromuscular Re-education: Patient arrives to clinic with complaints of 8/10 dizziness symptoms and of headache.    VOR X2: Patient performed VOR X 2 horiz in sitting 1 rep and in standing 2 reps of 1 minute each. Pt reported increase in dizziness with this activity. Pt demonstrated required verbal cues for technique as initially patient was moving target too far into peripheral field and pt was not tracking target with his eyes.     Balance Master Machine: Performed all conditions and 3 trails each of the sensory organization test. Patient challenged by sway referenced support activities with EO, EC and with EO combined with sway referenced surround. Pt with persistent left lateral weight shift lean at rest and with activities.  Performed rhythmic weight shifts side to side and ant/posterior at 1,2, and 3 sec intervals multiple reps. Pt tended to overshoot sways to the left and undershoot sways to the right.  Performed limits of stability sways multiple reps. Pt demonstrated the most difficulty with sways to the right, posterolateral to the right and anterolateral to the right. Pt did improve with practice.  Pt required sitting  rest break after this activity secondary to headache and dizziness symptoms.       PT Education - 10/06/15 0907    Education provided Yes   Education Details Discussed Barrister's clerkBalance Master machine and testing results reviewed with patient.    Person(s) Educated Patient   Methods Explanation;Demonstration   Comprehension Verbalized understanding             PT Long Term Goals - 10/06/15 0926    PT LONG TERM GOAL #1   Title Patient will be independent in home exercise program to improve strength/mobility for better functional independence with mobility.   Time 8   Period  Weeks   Status Achieved   PT LONG TERM GOAL #2   Title Patient will decrease his dizziness during ambulation and mobility  to 25% of the current amount of dizziness.    Baseline Reports 8/10 dizziness on 09/12/2015   Time 8   Period Weeks   Status On-going   PT LONG TERM GOAL #3   Title Patient will demonstrate reduced falls risk as evidenced by Dynamic Gait Index (DGI) 21/24 or greater.   Baseline scored 19/24 on 09/12/2015   Time 8   Period Weeks   Status On-going   PT LONG TERM GOAL #4   Title Patient will reduce perceived disability to low levels as indicated by <40 on Dizziness Handicap Inventory.   Baseline scored 64 moderate on DHI on 09/12/2015   Time 8   Period Weeks   Status On-going   PT LONG TERM GOAL #5   Title Patient will have demonstrate decreased falls risk as indicated by Activities Specific Balance Confidence Scale score of 80% or greater.   Baseline scored 66.25% on 09/12/2015   Time 8   Period Weeks   Status On-going               Plan - 10/06/15 [id number]    Clinical Impression Statement Patient arrives to clinic with report of increased dizziness and headache this date. Pt continues to have reporduction of his dizziness symptoms with VOR X 2 exercise and was able to progress to standing. Patient improved with practice trials of sensory organization testing on the Balance Master machine. Pt consistently throughout testing demonstrated a proclivity to weight shift to left. Pt had the most difficulty with sway reference surface with EO and EC and sway referenced vision and surface categories. Patient would benefit from continued PT services to continue to try to address functional deficits including balance and subjective symptoms of dizziness and imbalance.     Rehab Potential Fair   Clinical Impairments Affecting Rehab Potential decreased ROM cervical spine; Positive indicators: motivated, family support   Negative Indicators: vertebral artery dissection with  stroke    PT Frequency 2x / week   PT Duration 8 weeks   PT Treatment/Interventions Balance training;Therapeutic activities;Neuromuscular re-education;Gait training;Patient/family education;Vestibular   PT Next Visit Plan Consider trying low light environment amb with ball toss over shoulder forward and retro at varying heights and activities on Airex balance beam. Consider placing left foot on ball while doing UEs activities working on decreasing reliance on left weight shift position. Consider progressing VOR X 2 with conflicting background next visit. In addition, will need to repeat functional outcome measures in the next visit or two.    PT Home Exercise Plan feet together and semi-tandem progressions with and without body turns and head turns on firm and foam; VOR, slow marching, SLS, amb with head turns, amb with  quick turns;    Consulted and Agree with Plan of Care Family member/caregiver;Patient      Patient will benefit from skilled therapeutic intervention in order to improve the following deficits and impairments:  Difficulty walking, Decreased balance, Decreased activity tolerance, Decreased endurance, Abnormal gait, Decreased coordination, Dizziness  Visit Diagnosis: Dizziness and giddiness  Difficulty in walking, not elsewhere classified     Problem List There are no active problems to display for this patient.  Mardelle Matte PT, DPT  Priyal Musquiz 10/06/2015, 9:33 AM  [place] [facility] MAIN [facility] SERVICES [address] [place], [place], [address] Phone: [phone]   Fax:  [phone]  Name: Guy Michael MRN: [id number] Date of Birth: Aug 02, 1950

## 2015-10-09 ENCOUNTER — Ambulatory Visit: Payer: 59 | Admitting: Physical Therapy

## 2015-10-10 ENCOUNTER — Encounter: Payer: 59 | Admitting: Physical Therapy

## 2015-10-11 ENCOUNTER — Ambulatory Visit: Payer: 59 | Admitting: Physical Therapy

## 2015-10-11 ENCOUNTER — Encounter: Payer: Self-pay | Admitting: Physical Therapy

## 2015-10-11 DIAGNOSIS — R262 Difficulty in walking, not elsewhere classified: Secondary | ICD-10-CM

## 2015-10-11 DIAGNOSIS — R42 Dizziness and giddiness: Secondary | ICD-10-CM | POA: Diagnosis not present

## 2015-10-11 NOTE — Therapy (Signed)
[place] MAIN [facility] SERVICES [address] [place], [place], [address] Phone: [phone]   Fax:  [phone]  Physical Therapy Treatment  Patient Details  Name: Guy Michael MRN: [id number] Date of Birth: Feb 21, 1951 Referring Provider: Anabel Bene  Encounter Date: 10/11/2015      PT End of Session - 10/11/15 1234    Visit Number 8   Number of Visits 17   Date for PT Re-Evaluation 11/19/2015   Authorization Type g codes   PT Start Time 0300   PT Stop Time 1226   PT Time Calculation (min) 60 min   Equipment Utilized During Treatment Gait belt   Activity Tolerance Patient tolerated treatment well   Behavior During Therapy [facility] for tasks assessed/performed      Past Medical History  Diagnosis Date  . Heart murmur     as a child  . GERD (gastroesophageal reflux disease)   . Arthritis     Past Surgical History  Procedure Laterality Date  . Rotator cuff repair    . Knee arthroscopy    . Colonoscopy    . Shoulder arthroscopy with open rotator cuff repair Right 05/31/2015    Procedure: SHOULDER ARTHROSCOPY WITH OPEN ROTATOR CUFF REPAIR;  Surgeon: Thornton Park, MD;  Location: [facility] ORS;  Service: Orthopedics;  Laterality: Right;    There were no vitals filed for this visit.      Subjective Assessment - 10/11/15 1126    Subjective Pt reports 3/10 headache on arrival and patient states he has been having a lot of headaches recently. Pt reports that he has been having chronic near daily headache issues since prior to the vertebral artery dissection and stroke.  Pt states he started taking a low dose of a new blood pressure medication for the past 2 days. Patient unable to recall the name of the blood pressure medication and will try to bring that information the next session so that medications can be updated in the EMR. Pt reports his dizziness was better yesterday and he was able to do yard work and prune trees. Pt states "I felt  great yesterday". Pt states today he woke up dizzy however.    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 Per MR, pt woke up and had severe dizziness, "fell to his left" and had nausea. Pt had diplopia. Pt went to the ER for care.    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 Discussed test results and compared to prior testing.  Clinical Test of Sensory Interaction for Balance    (CTSIB):  CONDITION TIME STRATEGY SWAY  Eyes open, firm surface 30 seconds ankle +1  Eyes closed, firm surface 30 seconds Ankle, hip +2  Eyes open, foam surface 30 seconds Ankle +2  Eyes closed, foam surface 5 seconds  +4     Neuromuscular Re-education:   VOR X2: Patient performed VOR X 2 horiz with conflicting background in standing 3 reps of 1 minute each. Pt required vc to not move target into his peripheral vision. Pt reports 5/10 dizziness with this activity.   Ball circles: Performed standing on right leg, while lightly placing other left foot on  soccer ball while doing tossing balloon with UEs against mirror. Performed multiple sets of this exercise.         PT Education - 10/11/15 1233    Education provided Yes   Education Details Discussed progress towards goals. Discussed test results and compared to prior testing. Discussed VOR X 2 and active eye movement between two targets progression of conflicting background   Person(s) Educated Patient   Methods Demonstration;Explanation;Verbal cues   Comprehension Verbalized understanding;Returned demonstration             PT Long Term Goals - 10/11/15 1235    PT LONG TERM GOAL #1   Title Patient will be independent in home exercise  program to improve strength/mobility for better functional independence with mobility.   Time 8   Period Weeks   Status Achieved   PT LONG TERM GOAL #2   Title Patient will decrease his dizziness during ambulation and mobility  to 25% of the current amount of dizziness.    Baseline Reports 8/10 dizziness on 09/12/2015   Time 8   Period Weeks   Status On-going   PT LONG TERM GOAL #3   Title Patient will demonstrate reduced falls risk as evidenced by Dynamic Gait Index (DGI) 21/24 or greater.   Baseline scored 19/24 on 09/12/2015; scored 22/24 on 10/11/2015   Time 8   Period Weeks   Status Achieved   PT LONG TERM GOAL #4   Title Patient will reduce perceived disability to low levels as indicated by <40 on Dizziness Handicap Inventory.   Baseline scored 64 moderate on [place] on 09/12/2015; scored 56 moderate on [place] on 10/11/2015   Time 8   Period Weeks   Status Partially Met   PT LONG TERM GOAL #5   Title Patient will have demonstrate decreased falls risk as indicated by Activities Specific Balance Confidence Scale score of 80% or greater.   Baseline scored 66.25% on 09/12/2015; scored 48.8 % on 10/10/2105   Time 8   Period Weeks   Status On-going               Plan - 10/11/15 1206    Clinical Impression Statement Retested functional outcome measures this date and patient is demonstrating progress towards goals and has met goals of independence with HEP and DGI score of 21/24 or greater. Pt scored 19/24 on teh DGI at initial evaluation and scored 22/24 this date. Pt's ABC scale went from 66% to 48% and DHI scored decreased from 64 to 56 which remains in the moderate perception of handicap level and is in need of intervention. Pt's TUG scored decresaed from 11.3 sec to 7.5 sec which indicates decreased falls risk. Pt  with CTSIB testing originally was only able to hold EC on firm for 20 seconds and EC on foam for 5 seconds and this date was able to hold for 30 seconds with +2 sway EC on firm  surface and on foam surface with EC held 8 seconds. 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 Patient reports he continues to have daily dizziness but reports he is now having some days in which his symptoms are noticably decreased compared to other days. Patient would benefit from continued PT services to try to further address goals, to try to decrease patient's subjective symptoms of dizziness, to improve patient's balance and to decrease falls risk.   Rehab Potential Fair   Clinical Impairments Affecting Rehab Potential decreased ROM cervical spine; Positive indicators: motivated,  family support   Negative Indicators: vertebral artery dissection with stroke    PT Frequency 2x / week   PT Duration 8 weeks   PT Treatment/Interventions Balance training;Therapeutic activities;Neuromuscular re-education;Gait training;Patient/family education;Vestibular   PT Next Visit Plan Consider trying low light environment amb with ball toss over shoulder forward and retro at varying heights and activities on Airex balance beam. Continue working on placing left foot on ball while doing UEs activities working on decreasing reliance on left weight shift position. Work on ambulation in [employer] with searching for targets and with head turns.    PT Home Exercise Plan feet together and semi-tandem progressions with and without body turns and head turns on firm and foam; VOR, slow marching, SLS, amb with head turns, amb with quick turns; active eye movements between 2 targets and VOR X 2 with conflicting background   Consulted and Agree with Plan of Care Family member/caregiver;Patient      Patient will benefit from skilled therapeutic intervention in order to improve the following deficits and impairments:  Difficulty walking, Decreased balance, Decreased activity tolerance, Decreased endurance, Abnormal gait, Decreased coordination, Dizziness  Visit Diagnosis: Dizziness and  giddiness  Difficulty in walking, not elsewhere classified     Problem List There are no active problems to display for this patient.  Lady Deutscher PT, DPT Murphy,Dorriea 10/11/2015, 2:00 PM  [place] MAIN [facility] SERVICES [address] [place], [place], [address] Phone: [phone]   Fax:  [phone]  Name: Guy Michael MRN: [id number] Date of Birth: 01/05/1951

## 2015-10-13 ENCOUNTER — Encounter: Payer: Self-pay | Admitting: Physical Therapy

## 2015-10-13 ENCOUNTER — Ambulatory Visit: Payer: 59 | Admitting: Physical Therapy

## 2015-10-13 DIAGNOSIS — R42 Dizziness and giddiness: Secondary | ICD-10-CM

## 2015-10-13 DIAGNOSIS — R262 Difficulty in walking, not elsewhere classified: Secondary | ICD-10-CM

## 2015-10-13 NOTE — Therapy (Signed)
[place] MAIN [facility] SERVICES [address] [place], [place], [address] Phone: [phone]   Fax:  [phone]  Physical Therapy Treatment  Patient Details  Name: Guy Michael MRN: [id number] Date of Birth: 03-Jan-1951 Referring Provider: Anabel Bene  Encounter Date: 10/13/2015      PT End of Session - 10/13/15 1623    Visit Number 9   Number of Visits 17   Date for PT Re-Evaluation 11/12/2015   Authorization Type g codes   PT Start Time 06/23/1034   PT Stop Time 1124   PT Time Calculation (min) 48 min   Equipment Utilized During Treatment Gait belt   Activity Tolerance Patient tolerated treatment well   Behavior During Therapy [facility] for tasks assessed/performed      Past Medical History  Diagnosis Date  . Heart murmur     as a child  . GERD (gastroesophageal reflux disease)   . Arthritis     Past Surgical History  Procedure Laterality Date  . Rotator cuff repair    . Knee arthroscopy    . Colonoscopy    . Shoulder arthroscopy with open rotator cuff repair Right 05/31/2015    Procedure: SHOULDER ARTHROSCOPY WITH OPEN ROTATOR CUFF REPAIR;  Surgeon: Thornton Park, MD;  Location: [facility] ORS;  Service: Orthopedics;  Laterality: Right;    There were no vitals filed for this visit.      Subjective Assessment - 10/13/15 1618    Subjective Patient reports that he was able to do tree pruning and yard work yesterday.    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 Per MR, pt woke up and had severe dizziness, "fell to his left" and had nausea. Pt had diplopia. Pt went to the ER for care.    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   Walking while scanning for visual targets: Performed ambulation in busy environment (medical mall) in busy visual environment (medical mall) while scanning for visual targets in hallway as called out by therapist and then repeated with patient performing random head turns horiz, vert and [place] toss over shoulder: Pt performed multiple 85' trials of forward and retro ambulation while tossing ball over one shoulder with return catch over opposite shoulder varying the ball position to head, shoulder and waist level to promote head turning and tilting.  Single Leg Stance Activities: Pt standing on one leg while opposite foot gently rests on soccer ball and does clockwise turns with foot on the ball. Switch legs and repeat. Multiple trials.   2" X 4" board:   On 2" X 4" board worked on sidestepping L/R 8' times 8 reps of each type  On Airex balance beam: Pt performed sidestepping L/R without head turns 6 reps times 5' each. Performed on firm surface and then on Airex balance beam tandem walking 5' times 4 reps.      PT Education - 10/13/15 1622    Education provided Yes   Education Details Discussed adding walking with conflicting background progression to VOR X 1 exercise   Person(s) Educated Patient   Methods Explanation;Demonstration  Comprehension Verbalized understanding             PT Long Term Goals - 10/11/15 1235    PT LONG TERM GOAL #1   Title Patient will be independent in home exercise program to improve strength/mobility for better functional independence with mobility.   Time 8   Period Weeks   Status Achieved   PT LONG TERM GOAL #2   Title Patient will decrease his dizziness during ambulation and mobility  to 25% of the current amount of dizziness.    Baseline Reports 8/10 dizziness on 09/12/2015   Time 8   Period Weeks   Status On-going   PT LONG TERM GOAL #3   Title Patient will  demonstrate reduced falls risk as evidenced by Dynamic Gait Index (DGI) 21/24 or greater.   Baseline scored 19/24 on 09/12/2015; scored 22/24 on 10/11/2015   Time 8   Period Weeks   Status Achieved   PT LONG TERM GOAL #4   Title Patient will reduce perceived disability to low levels as indicated by <40 on Dizziness Handicap Inventory.   Baseline scored 64 moderate on [place] on 09/12/2015; scored 56 moderate on [place] on 10/11/2015   Time 8   Period Weeks   Status Partially Met   PT LONG TERM GOAL #5   Title Patient will have demonstrate decreased falls risk as indicated by Activities Specific Balance Confidence Scale score of 80% or greater.   Baseline scored 66.25% on 09/12/2015; scored 48.8 % on 10/10/2105   Time 8   Period Weeks   Status On-going               Plan - 10/13/15 1625    Rehab Potential Fair   Clinical Impairments Affecting Rehab Potential decreased ROM cervical spine; Positive indicators: motivated, family support   Negative Indicators: vertebral artery dissection with stroke    PT Frequency 2x / week   PT Duration 8 weeks   PT Treatment/Interventions Balance training;Therapeutic activities;Neuromuscular re-education;Gait training;Patient/family education;Vestibular   PT Next Visit Plan Consider trying low light environment amb with ball toss over shoulder forward and retro at varying heights and activities on Airex balance beam. Continue working on placing left foot on ball while doing UEs activities working on decreasing reliance on left weight shift position. Work on ambulation in [employer] with searching for targets and with head turns.    PT Home Exercise Plan feet together and semi-tandem progressions with and without body turns and head turns on firm and foam; VOR, slow marching, SLS, amb with head turns, amb with quick turns; active eye movements between 2 targets and VOR X 2 with conflicting background   Consulted and Agree with Plan of Care Family  member/caregiver;Patient      Patient will benefit from skilled therapeutic intervention in order to improve the following deficits and impairments:  Difficulty walking, Decreased balance, Decreased activity tolerance, Decreased endurance, Abnormal gait, Decreased coordination, Dizziness  Visit Diagnosis: Dizziness and giddiness  Difficulty in walking, not elsewhere classified     Problem List There are no active problems to display for this patient.  Lady Deutscher PT, DPT Lady Deutscher 10/13/2015, 4:26 PM  [place] MAIN [facility] SERVICES [address] [place], [place], [address] Phone: [phone]   Fax:  [phone]  Name: Duan Scharnhorst MRN: [id number] Date of Birth: Aug 06, 1950

## 2015-10-16 ENCOUNTER — Ambulatory Visit: Payer: 59 | Admitting: Physical Therapy

## 2015-10-17 ENCOUNTER — Ambulatory Visit: Payer: 59

## 2015-10-17 VITALS — BP 137/82 | HR 81

## 2015-10-17 DIAGNOSIS — M6281 Muscle weakness (generalized): Secondary | ICD-10-CM

## 2015-10-17 DIAGNOSIS — R42 Dizziness and giddiness: Secondary | ICD-10-CM | POA: Diagnosis not present

## 2015-10-17 NOTE — Therapy (Signed)
[place] MAIN [facility] SERVICES [address] [place], [place], [address] Phone: [phone]   Fax:  [phone]  Physical Therapy Treatment  Patient Details  Name: Guy Michael MRN: [id number] Date of Birth: 1950-09-11 Referring Provider: Anabel Bene  Encounter Date: 10/17/2015      PT End of Session - 10/17/15 1357    Visit Number 10   Number of Visits 17   Date for PT Re-Evaluation 11/08/2015   Authorization Type g codes   PT Start Time 1350   PT Stop Time 1435   PT Time Calculation (min) 45 min   Equipment Utilized During Treatment Gait belt   Activity Tolerance Patient tolerated treatment well   Behavior During Therapy [facility] for tasks assessed/performed      Past Medical History  Diagnosis Date  . Heart murmur     as a child  . GERD (gastroesophageal reflux disease)   . Arthritis     Past Surgical History  Procedure Laterality Date  . Rotator cuff repair    . Knee arthroscopy    . Colonoscopy    . Shoulder arthroscopy with open rotator cuff repair Right 05/31/2015    Procedure: SHOULDER ARTHROSCOPY WITH OPEN ROTATOR CUFF REPAIR;  Surgeon: Thornton Park, MD;  Location: [facility] ORS;  Service: Orthopedics;  Laterality: Right;    Filed Vitals:   10/17/15 1355  BP: 137/82  Pulse: 81  SpO2: 98%        Subjective Assessment - 10/17/15 1353    Subjective Pt states that his dizziness has persisted and while his walking has improved he has not noticed improvement in his dizziness.  HEP is going well without any questions or concerns. He continues to have headaches and is taking BP meds. Pt spent time working in the yard this morning.    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 Per MR, pt woke up and had severe dizziness, "fell to his left" and had nausea. Pt had diplopia. Pt went to  the ER for care.    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 Yes   Pain Location Head   Pain Orientation Posterior   Pain Type Acute pain   Pain Onset Today       Neuromuscular Re-education:  VOR Performed 3 trials of forward and retro ambulation 35' while doing horiz VOR X 1 viewing with conflicting background.  Ball pass over shoulder: Pt performed multiple 30' trials of forward and retro ambulation in low light (fitness studio with lights off and external light filtering in) while tossing ball over one shoulder with return catch over opposite shoulder varying the ball position to head, shoulder and waist level to promote head turning and tilting.  Modified Tandem Modified tandem balance with horizontal head and head/body turns in low light (fitness studio with lights off and external light filtering in) 30 seconds each side, alternating LE forward;  Tandem gait Pt performed multiple 30' trials of forward tandem gait in low light (fitness studio with lights off and external light filtering in);  Single Leg Stance Activities: Pt standing on R leg while L foot gently rests on soccer ball and does UE reaching (alternating) while therapist calls out numbers on clock including crossing midline;  Stairs Ascend/descend stairs while passing ball horizontal from one hand to the next, one bout ascending and one descending;  Walking while scanning for visual targets: Performed ambulation in busy environment (medical mall) in busy visual environment (medical mall) while scanning for visual targets in hallway as called out by therapist and then repeated with horizontal ball tosses with therapist both R and L.   On Airex balance beam: Pt performed sidestepping L/R without head turns 6 reps times 5' each;  Eyes Closed Practiced firm surface wide and narrow stance with eyes closed followed  by Airex wide and narrow stance eyes closed x 30 seconds each; Firm eyes closed marches x 30 seconds;                          PT Education - 10/17/15 1355    Education provided Yes   Education Details Additional HEP exercises   Person(s) Educated Patient   Methods Explanation;Demonstration   Comprehension Verbalized understanding;Returned demonstration             PT Long Term Goals - 10/11/15 1235    PT LONG TERM GOAL #1   Title Patient will be independent in home exercise program to improve strength/mobility for better functional independence with mobility.   Time 8   Period Weeks   Status Achieved   PT LONG TERM GOAL #2   Title Patient will decrease his dizziness during ambulation and mobility  to 25% of the current amount of dizziness.    Baseline Reports 8/10 dizziness on 09/12/2015   Time 8   Period Weeks   Status On-going   PT LONG TERM GOAL #3   Title Patient will demonstrate reduced falls risk as evidenced by Dynamic Gait Index (DGI) 21/24 or greater.   Baseline scored 19/24 on 09/12/2015; scored 22/24 on 10/11/2015   Time 8   Period Weeks   Status Achieved   PT LONG TERM GOAL #4   Title Patient will reduce perceived disability to low levels as indicated by <40 on Dizziness Handicap Inventory.   Baseline scored 64 moderate on [place] on 09/12/2015; scored 56 moderate on [place] on 10/11/2015   Time 8   Period Weeks   Status Partially Met   PT LONG TERM GOAL #5   Title Patient will have demonstrate decreased falls risk as indicated by Activities Specific Balance Confidence Scale score of 80% or greater.   Baseline scored 66.25% on 09/12/2015; scored 48.8 % on 10/10/2105   Time 8   Period Weeks   Status On-going               Plan - 10/17/15 1358    Clinical Impression Statement Pt demonstrates improvement in safety with ambulation with less lateral veering. His outcome measures from 10/11/15 were improved from his initial evaluation. At that  time he was reporting improvement in his symptomatic dizziness however today he is particularly discouraged regarding his persistent dizziness. Pt was advised to contact [facility] to see if they would like to continue his RTC rehab since it was temporarily suspended following his CVA. Encouraged pt to consider riding along with his friends while they play golf as a social outlet. Encouraged pt to attempt to introduce more eyes closed activities into his HEP. Heavy education regarding safety with all eyes closed activities. Will continue to progress habituation exercises in order to reduce dizziness and improve his function.    Rehab Potential Fair   Clinical Impairments Affecting  Rehab Potential decreased ROM cervical spine; Positive indicators: motivated, family support   Negative Indicators: vertebral artery dissection with stroke    PT Frequency 2x / week   PT Duration 8 weeks   PT Treatment/Interventions Balance training;Therapeutic activities;Neuromuscular re-education;Gait training;Patient/family education;Vestibular   PT Next Visit Plan Continue practicing in low light environment as well as eyes closed activities. Continue working on placing left foot on ball while doing UEs activities working on decreasing reliance on left weight shift position.    PT Home Exercise Plan feet together and semi-tandem progressions with and without body turns and head turns on firm and foam; VOR, slow marching, SLS, amb with head turns, amb with quick turns; active eye movements between 2 targets and VOR X 2 with conflicting background. Encouraged pt to attempted eyes closed modified tandem balance as well as eyes closed marching. Heel/toe raises   Consulted and Agree with Plan of Care Family member/caregiver;Patient      Patient will benefit from skilled therapeutic intervention in order to improve the following deficits and impairments:  Difficulty walking, Decreased balance, Decreased activity  tolerance, Decreased endurance, Abnormal gait, Decreased coordination, Dizziness  Visit Diagnosis: Dizziness and giddiness  Muscle weakness (generalized)       G-Codes - 2015/11/11 1540    Functional Assessment Tool Used clinical judgement, TUG, 10 MW, 6 MW, DHI, ABC   Functional Limitation Mobility: Walking and moving around   Mobility: Walking and Moving Around Current Status ([id number]) At least 1 percent but less than 20 percent impaired, limited or restricted   Mobility: Walking and Moving Around Goal Status [phone]) 0 percent impaired, limited or restricted      Problem List There are no active problems to display for this patient.  Phillips Grout PT, DPT   Huprich,Jason November 11, 2015, 3:42 PM  [place] MAIN [facility] SERVICES [address] [place], [place], [address] Phone: [phone]   Fax:  [phone]  Name: Netanel Yannuzzi MRN: [id number] Date of Birth: 1951/01/23

## 2015-10-19 ENCOUNTER — Ambulatory Visit: Payer: 59 | Admitting: Physical Therapy

## 2015-10-27 ENCOUNTER — Ambulatory Visit: Payer: 59 | Attending: Neurology | Admitting: Physical Therapy

## 2015-10-27 ENCOUNTER — Encounter: Payer: Self-pay | Admitting: Physical Therapy

## 2015-10-27 DIAGNOSIS — R2681 Unsteadiness on feet: Secondary | ICD-10-CM | POA: Diagnosis present

## 2015-10-27 DIAGNOSIS — R42 Dizziness and giddiness: Secondary | ICD-10-CM

## 2015-10-27 DIAGNOSIS — R262 Difficulty in walking, not elsewhere classified: Secondary | ICD-10-CM | POA: Diagnosis present

## 2015-10-27 DIAGNOSIS — M6281 Muscle weakness (generalized): Secondary | ICD-10-CM | POA: Diagnosis present

## 2015-10-27 NOTE — Therapy (Signed)
[place] MAIN [facility] SERVICES [address] [place], [place], [address] Phone: [phone]   Fax:  [phone]  Physical Therapy Treatment  Patient Details  Name: Guy Michael MRN: [id number] Date of Birth: 1950/10/07 Referring Provider: Anabel Bene  Encounter Date: 10/27/2015      PT End of Session - 10/27/15 1143    Visit Number 11   Number of Visits 17   Date for PT Re-Evaluation 2015-11-13   Authorization Type g codes   PT Start Time 06/24/29   PT Stop Time 1110/06/24   PT Time Calculation (min) 41 min   Equipment Utilized During Treatment Gait belt   Activity Tolerance Patient tolerated treatment well   Behavior During Therapy [facility] for tasks assessed/performed      Past Medical History  Diagnosis Date  . Heart murmur     as a child  . GERD (gastroesophageal reflux disease)   . Arthritis     Past Surgical History  Procedure Laterality Date  . Rotator cuff repair    . Knee arthroscopy    . Colonoscopy    . Shoulder arthroscopy with open rotator cuff repair Right 05/31/2015    Procedure: SHOULDER ARTHROSCOPY WITH OPEN ROTATOR CUFF REPAIR;  Surgeon: Thornton Park, MD;  Location: [facility] ORS;  Service: Orthopedics;  Laterality: Right;    There were no vitals filed for this visit.      Subjective Assessment - 10/27/15 1036    Subjective Pt states that he had a great day yesterday. pt states he has not been having nearly ass many headaches this past week. Pt reports that he is going to go to the driving range tomorrow and try to hit a bucket of balls and next week he is going to try to ball a full round of golf. Pt reports that he went to the beach which is a 4 hour car trip each way this past weekend. Pt states that driving in a car and even watching TV increase his dizziness significantly and states he has to close his eyes at times. 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 Per MR, pt woke up and had severe dizziness, "fell to his left" and had nausea. Pt had diplopia. Pt went to the ER for care.    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 --  none stated   Pain Onset --      Neuromuscular Re-education: Pt arrives to clinic reporting 7/10 dizziness.   Card Sorting Activity:  While standing on right foot with left foot on ball, worked on reaching to sort playing cards taped to mirror alternating reaching hand and crossing midline with reaching outside BOS to sort. Repeated with left foot place gently on cone.   Dynadisc Activity: Pt stood with yellow Dynadisc under left foot and blue Dynadisc under right foot static balance and then progressed to tossing ball against mirror to the left and to the right to incorporate weight shifting. Pt required CGA with this activity and pt improved with practice.   Golf Simulation Activity: Standing on Airex pad, worked on bending over to place pegs in foam peg mat placed on floor to simulate teeing  up a golf ball. Pt reports that he normally tees up a ball by placing both feet hip width apart and bending over or he stands with feet in semi-tandem stance and bending over therefore practice both ways while standing on Airex pad. On firm surface practiced retrieving peg off the floor (to simulate getting golf ball out of cup) while using modified golfer's lift technique as patient placed majority of weight on left leg and brought right leg back but kept toes on the ground while bending forward to retrieve peg. Pt practice alternating standing on right and left leg for support and then practice just standing on right leg for support. Pt to perform for HEP.    Red floor mat  Activity: Worked on firm surface and then on red floor foam mat kicking soccer ball against wall with alternating kicking foot.  Pt was able to perform with SBA with no overt LOB.   Habituation: Worked on seated habituation exercise of nose towards left knee 10 reps and nose towards right knee 10 reps. Pt reported 7/10 dizziness at rest and 10/10 dizziness while performing this activity. Pt to perform for HEP.        PT Education - 10/27/15 1141    Education provided Yes   Education Details Discussed nose to knee and return upright habituation exercise and various options for how to safely tee up golf ball and how to retrieve golf ball from the cup working on [place].   Person(s) Educated Patient   Methods Explanation;Demonstration   Comprehension Verbalized understanding;Returned demonstration             PT Long Term Goals - 10/11/15 1235    PT LONG TERM GOAL #1   Title Patient will be independent in home exercise program to improve strength/mobility for better functional independence with mobility.   Time 8   Period Weeks   Status Achieved   PT LONG TERM GOAL #2   Title Patient will decrease his dizziness during ambulation and mobility  to 25% of the current amount of dizziness.    Baseline Reports 8/10 dizziness on 09/12/2015   Time 8   Period Weeks   Status On-going   PT LONG TERM GOAL #3   Title Patient will demonstrate reduced falls risk as evidenced by Dynamic Gait Index (DGI) 21/24 or greater.   Baseline scored 19/24 on 09/12/2015; scored 22/24 on 10/11/2015   Time 8   Period Weeks   Status Achieved   PT LONG TERM GOAL #4   Title Patient will reduce perceived disability to low levels as indicated by <40 on Dizziness Handicap Inventory.   Baseline scored 64 moderate on [place] on 09/12/2015; scored 56 moderate on [place] on 10/11/2015   Time 8   Period Weeks   Status Partially Met   PT LONG TERM GOAL #5   Title Patient will have demonstrate decreased falls  risk as indicated by Activities Specific Balance Confidence Scale score of 80% or greater.   Baseline scored 66.25% on 09/12/2015; scored 48.8 % on 10/10/2105   Time 8   Period Weeks   Status On-going               Plan - 10/27/15 1143    Clinical Impression Statement Patient reporting that he had a great day yesterday without symptoms but states he has significant dizziness today rating as 7/10 at rest. Pt having intermittent good days with good relief of symptoms at times. Added habituation exercise to  HEP this date to try to further address dizziness symptoms. Pt demonstrating good progress with balance and weight shifting activities and has been able to work on progressions each visit as well as adding progressiosn to HEP.    Rehab Potential Fair   Clinical Impairments Affecting Rehab Potential decreased ROM cervical spine; Positive indicators: motivated, family support   Negative Indicators: vertebral artery dissection with stroke    PT Frequency 2x / week   PT Duration 8 weeks   PT Treatment/Interventions Balance training;Therapeutic activities;Neuromuscular re-education;Gait training;Patient/family education;Vestibular   PT Next Visit Plan Continue practicing in low light environment as well as eyes closed activities. Continue working on placing left foot on ball while doing UEs activities working on decreasing reliance on left weight shift position. Review habituation exercise and add additional exercises if indicated.    PT Home Exercise Plan feet together and semi-tandem progressions with and without body turns and head turns on firm and foam; VOR, slow marching, SLS, amb with head turns, amb with quick turns; active eye movements between 2 targets and VOR X 2 with conflicting background. Encouraged pt to attempted eyes closed modified tandem balance as well as eyes closed marching. Heel/toe raises   Consulted and Agree with Plan of Care Family member/caregiver;Patient       Patient will benefit from skilled therapeutic intervention in order to improve the following deficits and impairments:  Difficulty walking, Decreased balance, Decreased activity tolerance, Decreased endurance, Abnormal gait, Decreased coordination, Dizziness  Visit Diagnosis: Dizziness and giddiness  Unsteadiness on feet     Problem List There are no active problems to display for this patient.  Lady Deutscher PT, DPT  Lady Deutscher 10/27/2015, 12:06 PM  [place] MAIN [facility] SERVICES [address] [place], [place], [address] Phone: [phone]   Fax:  [phone]  Name: Guy Michael MRN: [id number] Date of Birth: 1951/03/01

## 2015-11-03 ENCOUNTER — Ambulatory Visit: Payer: 59

## 2015-11-03 ENCOUNTER — Encounter: Payer: Self-pay | Admitting: Physical Therapy

## 2015-11-03 VITALS — BP 142/74 | HR 63

## 2015-11-03 DIAGNOSIS — R42 Dizziness and giddiness: Secondary | ICD-10-CM

## 2015-11-03 DIAGNOSIS — R262 Difficulty in walking, not elsewhere classified: Secondary | ICD-10-CM

## 2015-11-03 DIAGNOSIS — M6281 Muscle weakness (generalized): Secondary | ICD-10-CM

## 2015-11-03 DIAGNOSIS — R2681 Unsteadiness on feet: Secondary | ICD-10-CM

## 2015-11-03 NOTE — Therapy (Signed)
[facility] MAIN [facility] SERVICES [address] [place], [place], [address] Phone: [phone]   Fax:  [phone]  Physical Therapy Treatment  Patient Details  Name: Guy Michael MRN: [id number] Date of Birth: 1950-06-07 Referring Provider: Morene Crocker  Encounter Date: 11/03/2015      PT End of Session - 11/03/15 0857    Visit Number 12   Number of Visits 23   Date for PT Re-Evaluation 2016/01/09   Authorization Type g codes   PT Start Time 0900   PT Stop Time 0945   PT Time Calculation (min) 45 min   Equipment Utilized During Treatment Gait belt   Activity Tolerance Patient tolerated treatment well   Behavior During Therapy [facility] for tasks assessed/performed      Past Medical History  Diagnosis Date  . Heart murmur     as a child  . GERD (gastroesophageal reflux disease)   . Arthritis     Past Surgical History  Procedure Laterality Date  . Rotator cuff repair    . Knee arthroscopy    . Colonoscopy    . Shoulder arthroscopy with open rotator cuff repair Right 05/31/2015    Procedure: SHOULDER ARTHROSCOPY WITH OPEN ROTATOR CUFF REPAIR;  Surgeon: Juanell Fairly, MD;  Location: [facility] ORS;  Service: Orthopedics;  Laterality: Right;    Filed Vitals:   11/03/15 0909  BP: 142/74  Pulse: 63  SpO2: 100%        Subjective Assessment - 11/03/15 0856    Subjective Pt reports he is doing well today. He states that it is about an "average" day for him. He has been out to play golf twice since his last therapy session. He reports some difficulty reading the greens due to his altered perception of upright. He struggled some with getting the tees to be perfectly vertical in order to support the ball. Pt denies difficulty with his balance on uneven lies. Pt believes that his balance is improving but is still frustrated about his symptomatic dizziness.    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 Per MR, pt woke up and had severe dizziness, "fell to his left" and had nausea. Pt had diplopia. Pt went to the ER for care.    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 No/denies            [facility] PT Assessment - 11/03/15 0904    Observation/Other Assessments   Other Surveys  Other Surveys   Activities of Balance Confidence Scale (ABC Scale)  86.25%   Dizziness Handicap Inventory (DHI)  44/100   6 Minute Walk- Baseline   6 Minute Walk- Baseline yes   BP (mmHg) 142/74 mmHg   HR (bpm) 63   02 Sat (%RA) 100 %   6 Minute walk- Post Test   6 Minute Walk Post Test yes   BP (mmHg) 161/76 mmHg   HR (bpm) 65   02 Sat (%RA) 100 %   Modified Borg Scale for Dyspnea 6-   Perceived Rate of Exertion (Borg) 15- Hard   6 minute walk test results    Aerobic Endurance Distance Walked 1750   Endurance additional comments Pt reports slight increase in posterior headache during activity which decreases  when finished. Good stability noted during ambulation without lateral veering   Standardized Balance Assessment   Standardized Balance Assessment Dynamic Gait Index   Dynamic Gait Index   Level Surface Normal   Change in Gait Speed Normal   Gait with Horizontal Head Turns Mild Impairment   Gait with Vertical Head Turns Mild Impairment   Gait and Pivot Turn Normal   Step Over Obstacle Normal   Step Around Obstacles Normal   Steps Normal   Total Score 22       TREATMENT  Physical Performance Reviewed results of DHI and ABC with patient; Performed 6MWT and DGI with patient; Updated goals and discussed plan of care with patient for continuation of therapy;  Neuromuscular Re-education: Pt arrives to clinic reporting 7/10 dizziness.    Dynadisc Activity: Pt stood with yellow Dynadisc  under right foot foot while balance L foot on ball, dynamic reaching to playing cards on mirror. Challenged pt by crossing midline as well as reaching high and low. Pt required CGA with this activity and pt improved with practice.   Golf Simulation Activity: Standing on Airex pad, worked on deep squats as well as bending over to place ball on simulated tees. Reviewed modified golfer's lift technique; Practiced plumb bobbing technique for reading greens to help with orientation to vertical;                         PT Education - 11/03/15 0856    Education provided Yes   Education Details Plan of care   Person(s) Educated Patient   Methods Explanation   Comprehension Verbalized understanding             PT Long Term Goals - 11/03/15 0924    PT LONG TERM GOAL #1   Title Patient will be independent in home exercise program to improve strength/mobility for better functional independence with mobility.   Time 8   Period Weeks   Status Achieved   PT LONG TERM GOAL #2   Title Patient will decrease his dizziness during ambulation and mobility  to 25% of the current amount of dizziness.    Baseline Reports 8/10 dizziness on 09/12/2015, 11/03/15: 7/10 worst with activity, baseline is currently around 5/10   Time 8   Period Weeks   Status On-going   PT LONG TERM GOAL #3   Title Patient will demonstrate reduced falls risk as evidenced by Dynamic Gait Index (DGI) 21/24 or greater.   Baseline scored 19/24 on 09/12/2015; scored 22/24 on 10/11/2015, 11/03/15: 22/24   Time 8   Period Weeks   Status Achieved   PT LONG TERM GOAL #4   Title Patient will reduce perceived disability to low levels as indicated by <40 on Dizziness Handicap Inventory.   Baseline scored 64 moderate on DHI on 09/12/2015; scored 56 moderate on DHI on 10/11/2015, 11/03/15: 44/100   Time 8   Period Weeks   Status On-going   PT LONG TERM GOAL #5   Title Patient will have demonstrate decreased falls risk as  indicated by Activities Specific Balance Confidence Scale score of 80% or greater.   Baseline scored 66.25% on 09/12/2015; scored 48.8 % on 10/10/2105, 11/03/15: 86.25%   Time 8   Period Weeks   Status Achieved               Plan - 11/03/15 0857    Clinical Impression Statement Pt reports significant improvement in balance but continued frustration with symptomatic  dizziness. He has been able to resume some normal leisure/work activities such as golfing and yardwork. He demonstrates clinically significant improvement in ABC scale scoring 86.25%. DGI remains at 22/24 and is primarily limited by lateral gait deviation with horizontal and vertical head turns. DHI has improved to 44/100 today compared to 64/100 at initial evaluation. Pt will continue to benefit from skilled PT services to address deficits in gait, balance, and dizziness in order to improve function with work and leisure activities. Pt plans to call to schedule his next appointment as he needs to contact his insurance company regarding coverage for physical therapy.    Rehab Potential Fair   Clinical Impairments Affecting Rehab Potential decreased ROM cervical spine; Positive indicators: motivated, family support   Negative Indicators: vertebral artery dissection with stroke    PT Frequency 1x / week   PT Duration 8 weeks   PT Treatment/Interventions Balance training;Therapeutic activities;Neuromuscular re-education;Gait training;Patient/family education;Vestibular   PT Next Visit Plan Pt to call to schedule appt after checking with insurance regarding payment. Continue practicing in low light environment as well as eyes closed activities. Continue working on placing left foot on ball while doing UEs activities working on decreasing reliance on left weight shift position. Review habituation exercise and add additional exercises if indicated.    PT Home Exercise Plan feet together and semi-tandem progressions with and without body  turns and head turns on firm and foam; VOR, slow marching, SLS, amb with head turns, amb with quick turns; active eye movements between 2 targets and VOR X 2 with conflicting background. Encouraged pt to attempted eyes closed modified tandem balance as well as eyes closed marching. Heel/toe raises   Consulted and Agree with Plan of Care Family member/caregiver;Patient      Patient will benefit from skilled therapeutic intervention in order to improve the following deficits and impairments:  Difficulty walking, Decreased balance, Decreased activity tolerance, Decreased endurance, Abnormal gait, Decreased coordination, Dizziness  Visit Diagnosis: Dizziness and giddiness  Unsteadiness on feet  Muscle weakness (generalized)  Difficulty in walking, not elsewhere classified       G-Codes - 11/28/2015 1532    Functional Assessment Tool Used clinical judgement, DGI, 6 MWT, DHI, ABC   Functional Limitation Mobility: Walking and moving around   Mobility: Walking and Moving Around Current Status [phone]) At least 1 percent but less than 20 percent impaired, limited or restricted   Mobility: Walking and Moving Around Goal Status [phone]) 0 percent impaired, limited or restricted      Problem List There are no active problems to display for this patient.  Lynnea Maizes PT, DPT   Stevin Bielinski 11/28/15, 3:34 PM  [place] [facility] MAIN [facility] SERVICES [address] [place], [place], [address] Phone: [phone]   Fax:  [phone]  Name: Guy Michael MRN: [id number] Date of Birth: 29-Jun-1950

## 2016-02-15 ENCOUNTER — Other Ambulatory Visit: Payer: Self-pay | Admitting: Neurology

## 2016-02-15 DIAGNOSIS — I7774 Dissection of vertebral artery: Secondary | ICD-10-CM

## 2016-02-28 ENCOUNTER — Other Ambulatory Visit: Payer: Self-pay | Admitting: Neurology

## 2016-02-28 ENCOUNTER — Ambulatory Visit
Admission: RE | Admit: 2016-02-28 | Discharge: 2016-02-28 | Disposition: A | Discharge status: Home / Self Care | Payer: 59 | Source: Ambulatory Visit | Attending: Neurology | Admitting: Neurology

## 2016-02-28 DIAGNOSIS — I6501 Occlusion and stenosis of right vertebral artery: Secondary | ICD-10-CM | POA: Insufficient documentation

## 2016-02-28 DIAGNOSIS — I7774 Dissection of vertebral artery: Secondary | ICD-10-CM | POA: Diagnosis present

## 2016-02-28 DIAGNOSIS — R42 Dizziness and giddiness: Secondary | ICD-10-CM | POA: Insufficient documentation

## 2016-02-28 LAB — POCT I-STAT CREATININE: CREATININE: 1 mg/dL (ref 0.61–1.24)

## 2016-02-28 MED ORDER — GADOBENATE DIMEGLUMINE 529 MG/ML IV SOLN
15.0000 mL | Freq: Once | INTRAVENOUS | Status: AC | PRN
  Administered 2016-02-28: 15 mL via INTRAVENOUS

## 2016-03-28 ENCOUNTER — Other Ambulatory Visit: Payer: Self-pay | Admitting: Neurological Surgery

## 2016-03-28 DIAGNOSIS — I7774 Dissection of vertebral artery: Secondary | ICD-10-CM

## 2016-04-08 ENCOUNTER — Other Ambulatory Visit: Payer: 59

## 2016-04-08 ENCOUNTER — Ambulatory Visit
Admission: RE | Admit: 2016-04-08 | Discharge: 2016-04-08 | Disposition: A | Discharge status: Home / Self Care | Payer: 59 | Source: Ambulatory Visit | Attending: Neurological Surgery | Admitting: Neurological Surgery

## 2016-04-08 DIAGNOSIS — I6523 Occlusion and stenosis of bilateral carotid arteries: Secondary | ICD-10-CM | POA: Diagnosis not present

## 2016-04-08 DIAGNOSIS — I7774 Dissection of vertebral artery: Secondary | ICD-10-CM | POA: Insufficient documentation

## 2016-04-08 HISTORY — DX: Essential (primary) hypertension: I10

## 2016-04-08 LAB — POCT I-STAT CREATININE: Creatinine, Ser: 1 mg/dL (ref 0.61–1.24)

## 2016-04-08 MED ORDER — IOPAMIDOL (ISOVUE-370) INJECTION 76%
75.0000 mL | Freq: Once | INTRAVENOUS | Status: AC | PRN
  Administered 2016-04-08: 75 mL via INTRAVENOUS

## 2016-11-19 ENCOUNTER — Ambulatory Visit: Payer: Self-pay | Admitting: Orthopedic Surgery

## 2016-11-25 ENCOUNTER — Inpatient Hospital Stay: Admission: RE | Admit: 2016-11-25 | Payer: 59 | Source: Ambulatory Visit

## 2016-11-27 ENCOUNTER — Encounter
Admission: RE | Admit: 2016-11-27 | Discharge: 2016-11-27 | Disposition: A | Discharge status: Home / Self Care | Payer: Medicare Other | Source: Ambulatory Visit | Attending: Orthopedic Surgery | Admitting: Orthopedic Surgery

## 2016-11-27 DIAGNOSIS — I10 Essential (primary) hypertension: Secondary | ICD-10-CM | POA: Insufficient documentation

## 2016-11-27 DIAGNOSIS — Z01818 Encounter for other preprocedural examination: Secondary | ICD-10-CM | POA: Diagnosis present

## 2016-11-27 HISTORY — DX: Hyperlipidemia, unspecified: E78.5

## 2016-11-27 HISTORY — DX: Cerebral infarction, unspecified: I63.9

## 2016-11-27 LAB — CBC
HEMATOCRIT: 43.4 % (ref 40.0–52.0)
HEMOGLOBIN: 15 g/dL (ref 13.0–18.0)
MCH: 31.6 pg (ref 26.0–34.0)
MCHC: 34.5 g/dL (ref 32.0–36.0)
MCV: 91.6 fL (ref 80.0–100.0)
Platelets: 177 10*3/uL (ref 150–440)
RBC: 4.74 MIL/uL (ref 4.40–5.90)
RDW: 12.9 % (ref 11.5–14.5)
WBC: 4.9 10*3/uL (ref 3.8–10.6)

## 2016-11-27 NOTE — Patient Instructions (Signed)
Your procedure is scheduled on: December 02, 2016 [facility](MONDAY) Report to Same Day Surgery 2nd floor medical mall (Medical Mall Entrance-take elevator on left to 2nd floor.  Check in with surgery information desk.) To find out your arrival time please call [phone] between 1PM - 3PM on November 29, 2016 (FRIDAY )  Remember: Instructions that are not followed completely may result in serious medical risk, up to and including death, or upon the discretion of your surgeon and anesthesiologist your surgery may need to be rescheduled.    _x___ 1. Do not eat food or drink liquids after midnight. No gum chewing or  hard candies                               __x__ 2. No Alcohol for 24 hours before or after surgery.   __x__3. No Smoking for 24 prior to surgery.   ____  4. Bring all medications with you on the day of surgery if instructed.    __x__ 5. Notify your doctor if there is any change in your medical condition     (cold, fever, infections).     Do not wear jewelry, make-up, hairpins, clips or nail polish.  Do not wear lotions, powders, or perfumes. .  Do not shave 48 hours prior to surgery. Men may shave face and neck.  Do not bring valuables to the hospital.    [facility] is [facility] for any belongings or valuables.               Contacts, dentures or bridgework may not be worn into surgery.  Leave your suitcase in the car. After surgery it may be brought to your room.  For patients admitted to the hospital, discharge time is determined by your treatment  team                     Patients discharged the day of surgery will not be allowed to drive home.  You will need someone to drive you home and stay with you the night of your procedure.    Please read over the following fact sheets that you were given:   [facility] Preparing for Surgery and or MRSA Information   TAKE THE FOLLOWING MEDICATIONS THE MORNING OF SURGERY WITH A SIP OF WATER:  1.  AMLODIPINE  2.ATORVASTATIN  3.GABAPENTIN  4.OMEPRAZOLE (OMEPRAZOLE AT BEDTIME ON SUNDAY NIGHT --AUGUST 12 )  5.  6.  ____Fleets enema or Magnesium Citrate as directed.   _x___ Use CHG Soap or sage wipes as directed on instruction sheet   ____ Use inhalers on the day of surgery and bring to hospital day of surgery  ____ Stop Metformin and Janumet 2 days prior to surgery.    ____ Take 1/2 of usual insulin dose the night before surgery and none on the morning  surgery  _x___ Follow recommendations from Cardiologist, Pulmonologist or PCP regarding          stopping Aspirin, Coumadin, Plavix ,Eliquis, Effient, or Pradaxa, and Pletal. (PATIENT HAS STOPPED ASPIRIN AND PLAVIX ON AUGUST 6 )  X____Stop Anti-inflammatories such as Advil, Aleve, Ibuprofen, Motrin, Naproxen, Naprosyn, Goodies powders or aspirin products. OK to take Tylenol  (STOP NAPROSYN NOW )   _x___ Stop supplements until after surgery.  But may continue Vitamin D, Vitamin B,and multivitamin         ____ Bring C-Pap to the hospital.

## 2016-11-28 NOTE — Pre-Procedure Instructions (Signed)
CLEARED MODERATE RISK BY DR Kristie CowmanPOTTER,NEURO

## 2016-12-01 MED ORDER — CEFAZOLIN SODIUM-DEXTROSE 2-4 GM/100ML-% IV SOLN
2.0000 g | INTRAVENOUS | Status: AC
  Administered 2016-12-02: 2 g via INTRAVENOUS

## 2016-12-02 ENCOUNTER — Ambulatory Visit
Admission: RE | Admit: 2016-12-02 | Discharge: 2016-12-02 | Disposition: A | Discharge status: Home / Self Care | Payer: Medicare Other | Source: Ambulatory Visit | Attending: Orthopedic Surgery | Admitting: Orthopedic Surgery

## 2016-12-02 ENCOUNTER — Encounter
Admission: RE | Disposition: A | Discharge status: Home / Self Care | Payer: Self-pay | Source: Ambulatory Visit | Attending: Orthopedic Surgery

## 2016-12-02 ENCOUNTER — Ambulatory Visit: Payer: Medicare Other | Admitting: Certified Registered Nurse Anesthetist

## 2016-12-02 DIAGNOSIS — M94262 Chondromalacia, left knee: Secondary | ICD-10-CM | POA: Insufficient documentation

## 2016-12-02 DIAGNOSIS — Z7982 Long term (current) use of aspirin: Secondary | ICD-10-CM | POA: Insufficient documentation

## 2016-12-02 DIAGNOSIS — K219 Gastro-esophageal reflux disease without esophagitis: Secondary | ICD-10-CM | POA: Insufficient documentation

## 2016-12-02 DIAGNOSIS — M23204 Derangement of unspecified medial meniscus due to old tear or injury, left knee: Secondary | ICD-10-CM | POA: Insufficient documentation

## 2016-12-02 DIAGNOSIS — Z791 Long term (current) use of non-steroidal anti-inflammatories (NSAID): Secondary | ICD-10-CM | POA: Insufficient documentation

## 2016-12-02 DIAGNOSIS — E785 Hyperlipidemia, unspecified: Secondary | ICD-10-CM | POA: Insufficient documentation

## 2016-12-02 DIAGNOSIS — Z7902 Long term (current) use of antithrombotics/antiplatelets: Secondary | ICD-10-CM | POA: Insufficient documentation

## 2016-12-02 DIAGNOSIS — M65862 Other synovitis and tenosynovitis, left lower leg: Secondary | ICD-10-CM | POA: Insufficient documentation

## 2016-12-02 DIAGNOSIS — Z79899 Other long term (current) drug therapy: Secondary | ICD-10-CM | POA: Diagnosis not present

## 2016-12-02 DIAGNOSIS — Z8673 Personal history of transient ischemic attack (TIA), and cerebral infarction without residual deficits: Secondary | ICD-10-CM | POA: Insufficient documentation

## 2016-12-02 DIAGNOSIS — M23201 Derangement of unspecified lateral meniscus due to old tear or injury, left knee: Secondary | ICD-10-CM | POA: Insufficient documentation

## 2016-12-02 DIAGNOSIS — M1712 Unilateral primary osteoarthritis, left knee: Secondary | ICD-10-CM | POA: Diagnosis not present

## 2016-12-02 DIAGNOSIS — I10 Essential (primary) hypertension: Secondary | ICD-10-CM | POA: Diagnosis not present

## 2016-12-02 HISTORY — PX: CHONDROPLASTY: SHX5177

## 2016-12-02 HISTORY — PX: SYNOVECTOMY: SHX5180

## 2016-12-02 HISTORY — PX: KNEE ARTHROSCOPY WITH LATERAL MENISECTOMY: SHX6193

## 2016-12-02 SURGERY — ARTHROSCOPY, KNEE, WITH LATERAL MENISCECTOMY
Anesthesia: General | Site: Knee | Laterality: Left | Wound class: Clean

## 2016-12-02 MED ORDER — ACETAMINOPHEN 10 MG/ML IV SOLN
INTRAVENOUS | Status: DC | PRN
  Administered 2016-12-02: 1000 mg via INTRAVENOUS

## 2016-12-02 MED ORDER — BUPIVACAINE HCL (PF) 0.25 % IJ SOLN
INTRAMUSCULAR | Status: AC
  Filled 2016-12-02: qty 30

## 2016-12-02 MED ORDER — METHYLPREDNISOLONE ACETATE 40 MG/ML INJ SUSP (RADIOLOG
INTRAMUSCULAR | Status: DC | PRN
  Administered 2016-12-02: 40 mg

## 2016-12-02 MED ORDER — ONDANSETRON HCL 4 MG/2ML IJ SOLN
INTRAMUSCULAR | Status: AC
  Filled 2016-12-02: qty 2

## 2016-12-02 MED ORDER — OXYCODONE HCL 5 MG PO TABS
5.0000 mg | ORAL_TABLET | Freq: Once | ORAL | Status: AC | PRN
  Administered 2016-12-02: 5 mg via ORAL

## 2016-12-02 MED ORDER — DOCUSATE SODIUM 100 MG PO CAPS: 100.0000 mg | ORAL_CAPSULE | Freq: Two times a day (BID) | ORAL | Status: DC

## 2016-12-02 MED ORDER — PROPOFOL 10 MG/ML IV BOLUS
INTRAVENOUS | Status: AC
  Filled 2016-12-02: qty 20

## 2016-12-02 MED ORDER — HYDROCODONE-ACETAMINOPHEN 5-325 MG PO TABS: 1.0000 | ORAL_TABLET | Freq: Four times a day (QID) | ORAL | 0 refills | Status: AC | PRN

## 2016-12-02 MED ORDER — MIDAZOLAM HCL 2 MG/2ML IJ SOLN
INTRAMUSCULAR | Status: AC
  Filled 2016-12-02: qty 2

## 2016-12-02 MED ORDER — DEXAMETHASONE SODIUM PHOSPHATE 10 MG/ML IJ SOLN
INTRAMUSCULAR | Status: DC | PRN
  Administered 2016-12-02: 8 mg via INTRAVENOUS

## 2016-12-02 MED ORDER — CEFAZOLIN SODIUM-DEXTROSE 2-4 GM/100ML-% IV SOLN
INTRAVENOUS | Status: AC
  Filled 2016-12-02: qty 100

## 2016-12-02 MED ORDER — ONDANSETRON HCL 4 MG PO TABS: 4.0000 mg | ORAL_TABLET | Freq: Four times a day (QID) | ORAL | Status: DC | PRN

## 2016-12-02 MED ORDER — OXYCODONE HCL 5 MG PO TABS
ORAL_TABLET | ORAL | Status: AC
  Administered 2016-12-02: 5 mg via ORAL
  Filled 2016-12-02: qty 1

## 2016-12-02 MED ORDER — SENNA 8.6 MG PO TABS: 1.0000 | ORAL_TABLET | Freq: Two times a day (BID) | ORAL | Status: DC

## 2016-12-02 MED ORDER — EPINEPHRINE 30 MG/30ML IJ SOLN
INTRAMUSCULAR | Status: AC
  Filled 2016-12-02: qty 1

## 2016-12-02 MED ORDER — OXYCODONE-ACETAMINOPHEN 5-325 MG PO TABS: 1.0000 | ORAL_TABLET | ORAL | Status: DC | PRN

## 2016-12-02 MED ORDER — DOCUSATE SODIUM 100 MG PO CAPS: 100.0000 mg | ORAL_CAPSULE | Freq: Every day | ORAL | 2 refills | Status: AC | PRN

## 2016-12-02 MED ORDER — HYDROCODONE-ACETAMINOPHEN 5-325 MG PO TABS: 1.0000 | ORAL_TABLET | ORAL | Status: DC | PRN

## 2016-12-02 MED ORDER — MORPHINE SULFATE (PF) 4 MG/ML IV SOLN
INTRAVENOUS | Status: AC
  Filled 2016-12-02: qty 1

## 2016-12-02 MED ORDER — METOCLOPRAMIDE HCL 10 MG PO TABS: 5.0000 mg | ORAL_TABLET | Freq: Three times a day (TID) | ORAL | Status: DC | PRN

## 2016-12-02 MED ORDER — METOCLOPRAMIDE HCL 5 MG/ML IJ SOLN: 5.0000 mg | Freq: Three times a day (TID) | INTRAMUSCULAR | Status: DC | PRN

## 2016-12-02 MED ORDER — ACETAMINOPHEN 10 MG/ML IV SOLN
INTRAVENOUS | Status: AC
  Filled 2016-12-02: qty 100

## 2016-12-02 MED ORDER — MORPHINE SULFATE 5 MG/ML IJ SOLN
INTRAMUSCULAR | Status: DC | PRN
  Administered 2016-12-02: 4 mg via INTRAVENOUS

## 2016-12-02 MED ORDER — FENTANYL CITRATE (PF) 100 MCG/2ML IJ SOLN
INTRAMUSCULAR | Status: AC
  Filled 2016-12-02: qty 2

## 2016-12-02 MED ORDER — MIDAZOLAM HCL 2 MG/2ML IJ SOLN
INTRAMUSCULAR | Status: DC | PRN
Start: 2016-12-02 — End: 2016-12-02
  Administered 2016-12-02: 2 mg via INTRAVENOUS

## 2016-12-02 MED ORDER — ROPIVACAINE HCL 10 MG/ML IJ SOLN
INTRAMUSCULAR | Status: DC | PRN
  Administered 2016-12-02: 20 mL via EPIDURAL

## 2016-12-02 MED ORDER — LACTATED RINGERS IV SOLN
INTRAVENOUS | Status: DC
  Administered 2016-12-02: 09:00:00 via INTRAVENOUS

## 2016-12-02 MED ORDER — ACETAMINOPHEN 500 MG PO TABS
1000.0000 mg | ORAL_TABLET | Freq: Once | ORAL | Status: AC
  Administered 2016-12-02: 1000 mg via ORAL

## 2016-12-02 MED ORDER — LACTATED RINGERS IV SOLN: INTRAVENOUS | Status: DC

## 2016-12-02 MED ORDER — LIDOCAINE HCL (PF) 2 % IJ SOLN
INTRAMUSCULAR | Status: AC
  Filled 2016-12-02: qty 2

## 2016-12-02 MED ORDER — MEPERIDINE HCL 50 MG/ML IJ SOLN: 6.2500 mg | INTRAMUSCULAR | Status: DC | PRN

## 2016-12-02 MED ORDER — LIDOCAINE HCL (CARDIAC) 20 MG/ML IV SOLN
INTRAVENOUS | Status: DC | PRN
  Administered 2016-12-02: 80 mg via INTRAVENOUS

## 2016-12-02 MED ORDER — GABAPENTIN 300 MG PO CAPS: 300.0000 mg | ORAL_CAPSULE | Freq: Once | ORAL | Status: DC

## 2016-12-02 MED ORDER — ACETAMINOPHEN 500 MG PO TABS
ORAL_TABLET | ORAL | Status: AC
  Administered 2016-12-02: 1000 mg via ORAL
  Filled 2016-12-02: qty 2

## 2016-12-02 MED ORDER — PROMETHAZINE HCL 25 MG/ML IJ SOLN: 6.2500 mg | INTRAMUSCULAR | Status: DC | PRN

## 2016-12-02 MED ORDER — FENTANYL CITRATE (PF) 100 MCG/2ML IJ SOLN
25.0000 ug | INTRAMUSCULAR | Status: DC | PRN
  Administered 2016-12-02: 50 ug via INTRAVENOUS

## 2016-12-02 MED ORDER — ONDANSETRON HCL 4 MG/2ML IJ SOLN
INTRAMUSCULAR | Status: DC | PRN
  Administered 2016-12-02: 4 mg via INTRAVENOUS

## 2016-12-02 MED ORDER — METHYLPREDNISOLONE ACETATE 40 MG/ML IJ SUSP
INTRAMUSCULAR | Status: AC
  Filled 2016-12-02: qty 1

## 2016-12-02 MED ORDER — MORPHINE SULFATE (PF) 4 MG/ML IV SOLN: 1.0000 mg | INTRAVENOUS | Status: DC | PRN

## 2016-12-02 MED ORDER — CHLORHEXIDINE GLUCONATE 4 % EX LIQD: 60.0000 mL | Freq: Once | CUTANEOUS | Status: DC

## 2016-12-02 MED ORDER — ONDANSETRON HCL 4 MG/2ML IJ SOLN: 4.0000 mg | Freq: Four times a day (QID) | INTRAMUSCULAR | Status: DC | PRN

## 2016-12-02 MED ORDER — ROPIVACAINE HCL 5 MG/ML IJ SOLN
INTRAMUSCULAR | Status: AC
  Filled 2016-12-02: qty 20

## 2016-12-02 MED ORDER — EPINEPHRINE 30 MG/30ML IJ SOLN
INTRAMUSCULAR | Status: DC | PRN
  Administered 2016-12-02: 3 mL

## 2016-12-02 MED ORDER — BUPIVACAINE-EPINEPHRINE (PF) 0.25% -1:200000 IJ SOLN
INTRAMUSCULAR | Status: DC | PRN
  Administered 2016-12-02: 10 mL via PERINEURAL

## 2016-12-02 MED ORDER — FENTANYL CITRATE (PF) 100 MCG/2ML IJ SOLN
INTRAMUSCULAR | Status: DC | PRN
  Administered 2016-12-02 (×4): 25 ug via INTRAVENOUS

## 2016-12-02 MED ORDER — PROPOFOL 10 MG/ML IV BOLUS
INTRAVENOUS | Status: DC | PRN
  Administered 2016-12-02: 180 mg via INTRAVENOUS

## 2016-12-02 MED ORDER — OXYCODONE HCL 5 MG/5ML PO SOLN: 5.0000 mg | Freq: Once | ORAL | Status: AC | PRN

## 2016-12-02 MED ORDER — DEXAMETHASONE SODIUM PHOSPHATE 10 MG/ML IJ SOLN
INTRAMUSCULAR | Status: AC
  Filled 2016-12-02: qty 1

## 2016-12-02 SURGICAL SUPPLY — 32 items
ADAPTER IRRIG TUBE 2 SPIKE SOL (ADAPTER) ×6 IMPLANT
BASIN GRAD PLASTIC 32OZ STRL (MISCELLANEOUS) ×3 IMPLANT
BLADE FULL RADIUS 3.5 (BLADE) IMPLANT
BLADE SHAVER 4.5 DBL SERAT CV (CUTTER) ×3 IMPLANT
BLADE SURG SZ11 CARB STEEL (BLADE) ×3 IMPLANT
BRUSH SCRUB EZ  4% CHG (MISCELLANEOUS) ×4
BRUSH SCRUB EZ 4% CHG (MISCELLANEOUS) ×2 IMPLANT
CHLORAPREP W/TINT 26ML (MISCELLANEOUS) ×3 IMPLANT
COOLER POLAR GLACIER W/PUMP (MISCELLANEOUS) IMPLANT
DRAPE IMP U-DRAPE 54X76 (DRAPES) ×3 IMPLANT
GAUZE PETRO XEROFOAM 1X8 (MISCELLANEOUS) ×3 IMPLANT
GAUZE SPONGE 4X4 12PLY STRL (GAUZE/BANDAGES/DRESSINGS) ×3 IMPLANT
GLOVE INDICATOR 8.0 STRL GRN (GLOVE) ×3 IMPLANT
GLOVE SURG ORTHO 8.0 STRL STRW (GLOVE) ×3 IMPLANT
GOWN STRL REUS W/ TWL LRG LVL3 (GOWN DISPOSABLE) ×1 IMPLANT
GOWN STRL REUS W/ TWL XL LVL3 (GOWN DISPOSABLE) ×1 IMPLANT
GOWN STRL REUS W/TWL LRG LVL3 (GOWN DISPOSABLE) ×2
GOWN STRL REUS W/TWL XL LVL3 (GOWN DISPOSABLE) ×2
IV LACTATED RINGER IRRG 3000ML (IV SOLUTION) ×4
IV LR IRRIG 3000ML ARTHROMATIC (IV SOLUTION) ×2 IMPLANT
KIT RM TURNOVER STRD PROC AR (KITS) ×3 IMPLANT
MANIFOLD NEPTUNE II (INSTRUMENTS) ×3 IMPLANT
NEEDLE SPNL 20GX3.5 QUINCKE YW (NEEDLE) ×3 IMPLANT
PACK ARTHROSCOPY KNEE (MISCELLANEOUS) ×3 IMPLANT
PAD ABD DERMACEA PRESS 5X9 (GAUZE/BANDAGES/DRESSINGS) ×6 IMPLANT
PAD WRAPON POLAR KNEE (MISCELLANEOUS) IMPLANT
SUT ETHILON 4-0 (SUTURE) ×2
SUT ETHILON 4-0 FS2 18XMFL BLK (SUTURE) ×1
SUTURE ETHLN 4-0 FS2 18XMF BLK (SUTURE) ×1 IMPLANT
TUBING ARTHRO INFLOW-ONLY STRL (TUBING) ×3 IMPLANT
WAND HAND CNTRL MULTIVAC 90 (MISCELLANEOUS) ×3 IMPLANT
WRAPON POLAR PAD KNEE (MISCELLANEOUS)

## 2016-12-02 NOTE — Anesthesia Procedure Notes (Signed)
Procedure Name: LMA Insertion Performed by: Yonis Carreon Pre-anesthesia Checklist: Patient identified, Patient being monitored, Timeout performed, Emergency Drugs available and Suction available Patient Re-evaluated:Patient Re-evaluated prior to induction Oxygen Delivery Method: Circle system utilized Preoxygenation: Pre-oxygenation with 100% oxygen Induction Type: IV induction Ventilation: Mask ventilation without difficulty LMA: LMA inserted LMA Size: 4.0 Tube type: Oral Number of attempts: 1 Placement Confirmation: positive ETCO2 and breath sounds checked- equal and bilateral Tube secured with: Tape Dental Injury: Teeth and Oropharynx as per pre-operative assessment        

## 2016-12-02 NOTE — Transfer of Care (Signed)
Immediate Anesthesia Transfer of Care Note  Patient: Guy Michael  Procedure(s) Performed: Procedure(s): KNEE ARTHROSCOPY WITH partial medial  MENISECTOMY (Left) CHONDROPLASTY (Left) SYNOVECTOMY-partial (Left)  Patient Location: PACU  Anesthesia Type:General  Level of Consciousness: sedated  Airway & Oxygen Therapy: Patient Spontanous Breathing and Patient connected to face mask oxygen  Post-op Assessment: Report given to RN and Post -op Vital signs reviewed and stable  Post vital signs: Reviewed and stable  Last Vitals:  Vitals:   12/02/16 0856  BP: (!) 151/82  Pulse: (!) 58  Resp: 18  SpO2: 99%    Last Pain: There were no vitals filed for this visit.       Complications: No apparent anesthesia complications

## 2016-12-02 NOTE — Anesthesia Postprocedure Evaluation (Signed)
Anesthesia Post Note  Patient: Guy Michael  Procedure(s) Performed: Procedure(s) (LRB): KNEE ARTHROSCOPY WITH partial medial  MENISECTOMY (Left) CHONDROPLASTY (Left) SYNOVECTOMY-partial (Left)  Patient location during evaluation: PACU Anesthesia Type: General Level of consciousness: awake and alert and oriented Pain management: pain level controlled Vital Signs Assessment: post-procedure vital signs reviewed and stable Respiratory status: spontaneous breathing Cardiovascular status: blood pressure returned to baseline Anesthetic complications: no     Last Vitals:  Vitals:   12/02/16 1400 12/02/16 1415  BP: (!) 157/86 (!) 157/90  Pulse: 62 65  Resp: 18 18  Temp:    SpO2:      Last Pain:  Vitals:   12/02/16 1415  PainSc: 6                  Kellon Chalk

## 2016-12-02 NOTE — OR Nursing (Signed)
preop report given to Michaele Offer Twine, RN by Kenna GilbertS Alica Shellhammer, RN

## 2016-12-02 NOTE — Anesthesia Preprocedure Evaluation (Signed)
Anesthesia Evaluation  Patient identified by MRN, date of birth, ID band Patient awake    Reviewed: Allergy & Precautions, NPO status , Patient's Chart, lab work & pertinent test results  History of Anesthesia Complications Negative for: history of anesthetic complications  Airway Mallampati: III  TM Distance: >3 FB Neck ROM: Full  Mouth opening: Limited Mouth Opening  Dental no notable dental hx.    Pulmonary neg pulmonary ROS, neg sleep apnea, neg COPD,    breath sounds clear to auscultation- rhonchi (-) wheezing      Cardiovascular Exercise Tolerance: Good hypertension, Pt. on medications (-) CAD, (-) Past MI and (-) Cardiac Stents  Rhythm:Regular Rate:Normal - Systolic murmurs and - Diastolic murmurs    Neuro/Psych CVA (residual dizziness) negative psych ROS   GI/Hepatic Neg liver ROS, GERD  ,  Endo/Other  negative endocrine ROSneg diabetes  Renal/GU negative Renal ROS     Musculoskeletal  (+) Arthritis ,   Abdominal (+) - obese,   Peds  Hematology negative hematology ROS (+)   Anesthesia Other Findings Past Medical History: No date: Arthritis No date: GERD (gastroesophageal reflux disease) No date: Heart murmur     Comment:  as a child No date: Hyperlipidemia No date: Hypertension 08/25/2015: Stroke [facility](HCC)   Reproductive/Obstetrics                             Anesthesia Physical Anesthesia Plan  ASA: II  Anesthesia Plan: General   Post-op Pain Management:    Induction: Intravenous  PONV Risk Score and Plan: 1 and Ondansetron and Dexamethasone  Airway Management Planned: LMA  Additional Equipment:   Intra-op Plan:   Post-operative Plan:   Informed Consent: I have reviewed the patients History and Physical, chart, labs and discussed the procedure including the risks, benefits and alternatives for the proposed anesthesia with the patient or authorized representative  who has indicated his/her understanding and acceptance.   Dental advisory given  Plan Discussed with: CRNA and Anesthesiologist  Anesthesia Plan Comments:         Anesthesia Quick Evaluation

## 2016-12-02 NOTE — Discharge Instructions (Signed)
Remove bandage on Friday and place band-aids over the two small incisions. No tub bath or swimming but it is ok to shower.   Follow-up as schedule with Dr. Marilynn RailBowers [facility] [phone]  AMBULATORY SURGERY  DISCHARGE INSTRUCTIONS   1) The drugs that you were given will stay in your system until tomorrow so for the next 24 hours you should not:  A) Drive an automobile B) Make any legal decisions C) Drink any alcoholic beverage   2) You may resume regular meals tomorrow.  Today it is better to start with liquids and gradually work up to solid foods.  You may eat anything you prefer, but it is better to start with liquids, then soup and crackers, and gradually work up to solid foods.   3) Please notify your doctor immediately if you have any unusual bleeding, trouble breathing, redness and pain at the surgery site, drainage, fever, or pain not relieved by medication.    4) Additional Instructions: TAKE A STOOL SOFTENER TWICE A DAY WHILE TAKING NARCOTIC PAIN MEDICINE TO PREVENT CONSTIPATION   Please contact your physician with any problems or Same Day Surgery at [phone], Monday through Friday 6 am to 4 pm, or [place] at [facility] Main number at [phone].

## 2016-12-02 NOTE — Anesthesia Post-op Follow-up Note (Signed)
Anesthesia QCDR form completed.        

## 2016-12-02 NOTE — H&P (Signed)
The patient has been re-examined, and the chart reviewed, and there have been no interval changes to the documented history and physical.  Plan a left knee arthroscopy today.  Anesthesia is not consulted regarding a peripheral nerve block for post-operative pain.  The risks, benefits, and alternatives have been discussed at length, and the patient is willing to proceed.    

## 2016-12-03 NOTE — Op Note (Signed)
  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 male with a diagnosis of TEAR OF MEDIAL MENISCUS who failed conservative management and elected for surgical management.    The risks benefits and alternatives were discussed with the patient preoperatively including the risks of infection, bleeding, nerve injury, knee stiffness, persistent pain, osteoarthritis and the need for further surgery. Medical  risks include DVT and pulmonary embolism, myocardial infarction, stroke, pneumonia, respiratory failure and death. The patient understood these risks and wished to proceed.   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 No loose bodies were identified within the medial or lateral gutters.  OPERATIVE PROCEDURE: Patient was met in the preoperative area. The operative extremity was signed with my initials according the hospital's correct site of surgery protocol.  The patient was brought to the operating room where they was placed supine on the operative table. General anesthesia was administered. The patient was prepped and draped in a sterile fashion.  A timeout was performed to verify the patient's name, date of birth, medical record number, correct site of surgery correct procedure to be performed. It was  also used to verify the patient received antibiotics that all appropriate instruments, and radiographic studies were available in the room. Once all in attendance were in agreement, the case began.  Proposed arthroscopy incisions were drawn out with a surgical marker. These were pre-injected with 0.25% marcaine with epinephrine. An 11 blade was used to establish an inferior lateral and inferomedial portals. The inferomedial portal was created using a 18-gauge spinal needle under direct visualization.  A full diagnostic examination of the knee was performed including the suprapatellar pouch, patellofemoral joint, medial lateral compartments as well as the medial lateral gutters, the intercondylar notch in the posterior knee.  Patient had the meniscal tear treated with a 4-0 resector shaver blade and upbiting oval basket. The meniscus was debrided until a stable rim was achieved. A chondroplasty of the medial femoral condyle, trochlea and undersurface of patella was also performed using a 4-0 resector shaver blade. A partial synovectomy was also performed using a 4-0 resector shaver blade and electrocautery. A final check was taken through the knee and no further derangement was noted.  The knee was then copiously lavaged. All arthroscopic instruments were removed. The 2 arthroscopy portals were closed with 4-0 nylon. A dry sterile and compressive dressing was applied, followed by a PolarCare icepack. The patient was brought to the PACU in stable condition. I was scrubbed and present for the entire case and all sharp and instrument counts were correct at the conclusion the case. I spoke with the patient's family postoperatively to let them know the case was performed without complication and the patient was stable in the recovery room.  Kurtis Bushman, MD

## 2017-01-30 ENCOUNTER — Encounter: Payer: Self-pay | Admitting: *Deleted

## 2017-01-31 ENCOUNTER — Ambulatory Visit
Admission: RE | Admit: 2017-01-31 | Discharge: 2017-01-31 | Disposition: A | Discharge status: Home / Self Care | Payer: Medicare Other | Source: Ambulatory Visit | Attending: Unknown Physician Specialty | Admitting: Unknown Physician Specialty

## 2017-01-31 ENCOUNTER — Ambulatory Visit: Payer: Medicare Other | Admitting: Anesthesiology

## 2017-01-31 ENCOUNTER — Encounter
Admission: RE | Disposition: A | Discharge status: Home / Self Care | Payer: Self-pay | Source: Ambulatory Visit | Attending: Unknown Physician Specialty

## 2017-01-31 ENCOUNTER — Encounter: Payer: Self-pay | Admitting: *Deleted

## 2017-01-31 DIAGNOSIS — K5730 Diverticulosis of large intestine without perforation or abscess without bleeding: Secondary | ICD-10-CM | POA: Insufficient documentation

## 2017-01-31 DIAGNOSIS — Z8673 Personal history of transient ischemic attack (TIA), and cerebral infarction without residual deficits: Secondary | ICD-10-CM | POA: Diagnosis not present

## 2017-01-31 DIAGNOSIS — Z1211 Encounter for screening for malignant neoplasm of colon: Secondary | ICD-10-CM | POA: Insufficient documentation

## 2017-01-31 DIAGNOSIS — M5136 Other intervertebral disc degeneration, lumbar region: Secondary | ICD-10-CM | POA: Insufficient documentation

## 2017-01-31 DIAGNOSIS — I10 Essential (primary) hypertension: Secondary | ICD-10-CM | POA: Insufficient documentation

## 2017-01-31 DIAGNOSIS — Z7982 Long term (current) use of aspirin: Secondary | ICD-10-CM | POA: Insufficient documentation

## 2017-01-31 DIAGNOSIS — K640 First degree hemorrhoids: Secondary | ICD-10-CM | POA: Diagnosis not present

## 2017-01-31 DIAGNOSIS — K219 Gastro-esophageal reflux disease without esophagitis: Secondary | ICD-10-CM | POA: Diagnosis not present

## 2017-01-31 DIAGNOSIS — E785 Hyperlipidemia, unspecified: Secondary | ICD-10-CM | POA: Diagnosis not present

## 2017-01-31 DIAGNOSIS — M1990 Unspecified osteoarthritis, unspecified site: Secondary | ICD-10-CM | POA: Insufficient documentation

## 2017-01-31 DIAGNOSIS — Z86010 Personal history of colonic polyps: Secondary | ICD-10-CM | POA: Insufficient documentation

## 2017-01-31 DIAGNOSIS — Z91040 Latex allergy status: Secondary | ICD-10-CM | POA: Diagnosis not present

## 2017-01-31 DIAGNOSIS — Z79899 Other long term (current) drug therapy: Secondary | ICD-10-CM | POA: Insufficient documentation

## 2017-01-31 HISTORY — DX: Headache: R51

## 2017-01-31 HISTORY — DX: Other intervertebral disc degeneration, lumbar region: M51.36

## 2017-01-31 HISTORY — DX: Headache, unspecified: R51.9

## 2017-01-31 HISTORY — DX: Other intervertebral disc degeneration, lumbar region without mention of lumbar back pain or lower extremity pain: M51.369

## 2017-01-31 HISTORY — PX: COLONOSCOPY WITH PROPOFOL: SHX5780

## 2017-01-31 SURGERY — COLONOSCOPY WITH PROPOFOL
Anesthesia: General

## 2017-01-31 MED ORDER — PROPOFOL 10 MG/ML IV BOLUS
INTRAVENOUS | Status: DC | PRN
  Administered 2017-01-31: 100 mg via INTRAVENOUS

## 2017-01-31 MED ORDER — PROPOFOL 500 MG/50ML IV EMUL: INTRAVENOUS | Status: DC | PRN

## 2017-01-31 MED ORDER — PHENYLEPHRINE HCL 10 MG/ML IJ SOLN
INTRAMUSCULAR | Status: DC | PRN
  Administered 2017-01-31: 100 ug via INTRAVENOUS

## 2017-01-31 MED ORDER — PROPOFOL 500 MG/50ML IV EMUL
INTRAVENOUS | Status: DC | PRN
  Administered 2017-01-31: 150 ug/kg/min via INTRAVENOUS

## 2017-01-31 MED ORDER — SODIUM CHLORIDE 0.9 % IV SOLN: INTRAVENOUS | Status: DC

## 2017-01-31 MED ORDER — SODIUM CHLORIDE 0.9 % IV SOLN
INTRAVENOUS | Status: DC
  Administered 2017-01-31 (×2): via INTRAVENOUS

## 2017-01-31 MED ORDER — LIDOCAINE 2% (20 MG/ML) 5 ML SYRINGE
INTRAMUSCULAR | Status: DC | PRN
  Administered 2017-01-31: 40 mg via INTRAVENOUS

## 2017-01-31 MED ORDER — PROPOFOL 10 MG/ML IV BOLUS
INTRAVENOUS | Status: AC
  Filled 2017-01-31: qty 20

## 2017-01-31 MED ORDER — FENTANYL CITRATE (PF) 100 MCG/2ML IJ SOLN
INTRAMUSCULAR | Status: AC
  Filled 2017-01-31: qty 2

## 2017-01-31 MED ORDER — LIDOCAINE HCL (PF) 2 % IJ SOLN
INTRAMUSCULAR | Status: AC
  Filled 2017-01-31: qty 10

## 2017-01-31 MED ORDER — PROPOFOL 500 MG/50ML IV EMUL
INTRAVENOUS | Status: AC
  Filled 2017-01-31: qty 50

## 2017-01-31 MED ORDER — FENTANYL CITRATE (PF) 100 MCG/2ML IJ SOLN
INTRAMUSCULAR | Status: DC | PRN
  Administered 2017-01-31 (×2): 50 ug via INTRAVENOUS

## 2017-01-31 NOTE — H&P (Signed)
Primary Care Physician:  Franciso Bend, MD Primary Gastroenterologist:  Dr. Mechele Collin  Pre-Procedure History & Physical: HPI:  Guy Michael is a 66 y.o. male is here for an colonoscopy.   Past Medical History:  Diagnosis Date  . Arthritis   . Degenerative disc disease, lumbar   . GERD (gastroesophageal reflux disease)   . Headache   . Heart murmur    as a child  . Hyperlipidemia   . Hypertension   . Stroke [facility]) 08/25/2015    Past Surgical History:  Procedure Laterality Date  . CHONDROPLASTY Left 12/02/2016   Procedure: CHONDROPLASTY;  Surgeon: Lyndle Herrlich, MD;  Location: [facility] ORS;  Service: Orthopedics;  Laterality: Left;  . COLONOSCOPY    . KNEE ARTHROSCOPY Left   . KNEE ARTHROSCOPY WITH LATERAL MENISECTOMY Left 12/02/2016   Procedure: KNEE ARTHROSCOPY WITH partial medial  MENISECTOMY;  Surgeon: Lyndle Herrlich, MD;  Location: [facility] ORS;  Service: Orthopedics;  Laterality: Left;  . ROTATOR CUFF REPAIR    . SHOULDER ARTHROSCOPY WITH OPEN ROTATOR CUFF REPAIR Right 05/31/2015   Procedure: SHOULDER ARTHROSCOPY WITH OPEN ROTATOR CUFF REPAIR;  Surgeon: Juanell Fairly, MD;  Location: [facility] ORS;  Service: Orthopedics;  Laterality: Right;  . SYNOVECTOMY Left 12/02/2016   Procedure: SYNOVECTOMY-partial;  Surgeon: Lyndle Herrlich, MD;  Location: [facility] ORS;  Service: Orthopedics;  Laterality: Left;    Prior to Admission medications   Medication Sig Start Date End Date Taking? Authorizing Provider  acetaminophen (TYLENOL) 325 MG tablet Take 650 mg by mouth every 4 (four) hours as needed.   Yes [provider]  acyclovir (ZOVIRAX) 800 MG tablet Take 800 mg by mouth 2 (two) times daily.   Yes [provider]  amLODipine (NORVASC) 5 MG tablet Take 5 mg by mouth daily. 09/29/16  Yes [provider]  aspirin EC 81 MG tablet Take 81 mg by mouth daily.   Yes [provider]  atorvastatin (LIPITOR) 80 MG tablet Take 40 mg by mouth daily. 09/29/16  Yes  [provider]  gabapentin (NEURONTIN) 100 MG capsule Take 200 mg by mouth 2 (two) times daily.   Yes [provider]  HYDROcodone-acetaminophen (NORCO) 5-325 MG tablet Take 1-2 tablets by mouth every 6 (six) hours as needed for moderate pain. 12/02/16  Yes Lyndle Herrlich, MD  naproxen sodium (ANAPROX) 220 MG tablet Take 220 mg by mouth daily as needed (for joint pain).   Yes [provider]  nortriptyline (PAMELOR) 10 MG capsule Take 10 mg by mouth at bedtime. 10/25/16  Yes [provider]  omeprazole (PRILOSEC) 20 MG capsule Take 20 mg by mouth daily.    Yes [provider]  clopidogrel (PLAVIX) 75 MG tablet Take 75 mg by mouth daily. 08/28/16   [provider]  docusate sodium (COLACE) 100 MG capsule Take 1 capsule (100 mg total) by mouth daily as needed. 12/02/16 12/02/17  Lyndle Herrlich, MD    Allergies as of 11/18/2016 - Review Complete 11/03/2015  Allergen Reaction Noted  . Latex Rash 04/08/2016    History reviewed. No pertinent family history.  Social History   Social History  . Marital status: Married    Spouse name: N/A  . Number of children: N/A  . Years of education: N/A   Occupational History  . Not on file.   Social History Main Topics  . Smoking status: Never Smoker  . Smokeless tobacco: Never Used  . Alcohol use Yes  Comment: occ  . Drug use: No  . Sexual activity: Not on file   Other Topics Concern  . Not on file   Social History Narrative  . No narrative on file    Review of Systems: See HPI, otherwise negative ROS  Physical Exam: BP (!) 137/94   Pulse 93   Temp 98.3 F (36.8 C) (Tympanic)   Resp 16   Ht  (1.702 m)   Wt 78 kg (172 lb)   SpO2 100%   BMI 26.94 kg/m  General:   Alert,  pleasant and cooperative in NAD Head:  Normocephalic and atraumatic. Neck:  Supple; no masses or thyromegaly. Lungs:  Clear throughout to auscultation.    Heart:  Regular rate and rhythm. Abdomen:   Soft, nontender and nondistended. Normal bowel sounds, without guarding, and without rebound.   Neurologic:  Alert and  oriented x4;  grossly normal neurologically.  Impression/Plan: Guy Michael is here for an colonoscopy to be performed for [facility] colon polyps.  Risks, benefits, limitations, and alternatives regarding  colonoscopy have been reviewed with the patient.  Questions have been answered.  All parties agreeable.   Lynnae Prude, MD  01/31/2017, 12:59 PM

## 2017-01-31 NOTE — Op Note (Signed)
[facility] Gastroenterology Patient Name: Guy Michael Procedure Date: 01/31/2017 1:06 PM MRN: [id number] Account #: [id number] Date of Birth: Jan 02, 1951 Admit Type: Outpatient Age: 66 Room: [facility] ENDO ROOM 3 Gender: Male Note Status: Finalized Procedure:            Colonoscopy Indications:          High risk colon cancer surveillance: Personal history                        of colonic polyps Providers:            Scot Jun, MD Referring MD:         Fayette Pho (Referring MD) Medicines:            Propofol per Anesthesia Complications:        No immediate complications. Procedure:            Pre-Anesthesia Assessment:                       - After reviewing the risks and benefits, the patient                        was deemed in satisfactory condition to undergo the                        procedure.                       After obtaining informed consent, the colonoscope was                        passed under direct vision. Throughout the procedure,                        the patient's blood pressure, pulse, and oxygen                        saturations were monitored continuously. The                        Colonoscope was introduced through the anus and                        advanced to the the cecum, identified by appendiceal                        orifice and ileocecal valve. The colonoscopy was                        somewhat difficult due to restricted mobility of the                        colon. Successful completion of the procedure was aided                        by applying abdominal pressure. The patient tolerated                        the procedure well. The quality of the bowel  preparation was excellent. Findings:      A few medium-mouthed diverticula were found in the sigmoid colon.      Internal hemorrhoids were found during endoscopy. The hemorrhoids were       medium-sized and Grade I (internal  hemorrhoids that do not prolapse).      Terminal ileum entered for a short distance was normal.      The exam was otherwise without abnormality. Impression:           - Diverticulosis in the sigmoid colon.                       - Internal hemorrhoids.                       - The examination was otherwise normal.                       - No specimens collected. Recommendation:       - Repeat colonoscopy in 5 years for surveillance. Scot Jun, MD 01/31/2017 1:41:03 PM This report has been signed electronically. Number of Addenda: 0 Note Initiated On: 01/31/2017 1:06 PM Scope Withdrawal Time: 0 hours 9 minutes 7 seconds  Total Procedure Duration: 0 hours 20 minutes 37 seconds       [facility]

## 2017-01-31 NOTE — Anesthesia Preprocedure Evaluation (Signed)
Anesthesia Evaluation  Patient identified by MRN, date of birth, ID band Patient awake    Reviewed: Allergy & Precautions, NPO status , Patient's Chart, lab work & pertinent test results  History of Anesthesia Complications Negative for: history of anesthetic complications  Airway Mallampati: II       Dental   Pulmonary neg sleep apnea, neg COPD,           Cardiovascular hypertension, Pt. on medications (-) Past MI and (-) CHF + Valvular Problems/Murmurs (murmur as a young child)      Neuro/Psych neg Seizures CVA (balance effected, dizziness), Residual Symptoms    GI/Hepatic Neg liver ROS, GERD  Medicated and Controlled,  Endo/Other  neg diabetes  Renal/GU negative Renal ROS     Musculoskeletal   Abdominal   Peds  Hematology   Anesthesia Other Findings   Reproductive/Obstetrics                             Anesthesia Physical Anesthesia Plan  ASA: III  Anesthesia Plan: General   Post-op Pain Management:    Induction: Intravenous  PONV Risk Score and Plan: Propofol infusion  Airway Management Planned: Nasal Cannula  Additional Equipment:   Intra-op Plan:   Post-operative Plan:   Informed Consent: I have reviewed the patients History and Physical, chart, labs and discussed the procedure including the risks, benefits and alternatives for the proposed anesthesia with the patient or authorized representative who has indicated his/her understanding and acceptance.     Plan Discussed with:   Anesthesia Plan Comments:         Anesthesia Quick Evaluation

## 2017-01-31 NOTE — Anesthesia Post-op Follow-up Note (Signed)
Anesthesia QCDR form completed.        

## 2017-01-31 NOTE — Anesthesia Postprocedure Evaluation (Signed)
Anesthesia Post Note  Patient: Guy Michael  Procedure(s) Performed: COLONOSCOPY WITH PROPOFOL (N/A )  Patient location during evaluation: Endoscopy Anesthesia Type: General Level of consciousness: awake and alert Pain management: pain level controlled Vital Signs Assessment: post-procedure vital signs reviewed and stable Respiratory status: spontaneous breathing and respiratory function stable Cardiovascular status: stable Anesthetic complications: no     Last Vitals:  Vitals:   01/31/17 1345 01/31/17 1352  BP:  112/81  Pulse: 67 76  Resp: 19 (!) 24  Temp: (!) 36.2 C   SpO2: 98% 99%    Last Pain:  Vitals:   01/31/17 1343  TempSrc: Tympanic                 KEPHART,WILLIAM K

## 2017-01-31 NOTE — Transfer of Care (Signed)
Immediate Anesthesia Transfer of Care Note  Patient: Guy Michael  Procedure(s) Performed: COLONOSCOPY WITH PROPOFOL (N/A )  Patient Location: PACU and Endoscopy Unit  Anesthesia Type:General  Level of Consciousness: sedated  Airway & Oxygen Therapy: Patient Spontanous Breathing and Patient connected to nasal cannula oxygen  Post-op Assessment: Report given to RN and Post -op Vital signs reviewed and stable  Post vital signs: Reviewed and stable  Last Vitals:  Vitals:   01/31/17 1211 01/31/17 1343  BP: (!) 137/94 99/66  Pulse: 93 66  Resp: 16 (!) 22  Temp: 36.8 C (!) 36.1 C  SpO2: 100% 97%    Last Pain:  Vitals:   01/31/17 1343  TempSrc: Tympanic         Complications: No apparent anesthesia complications

## 2017-02-03 ENCOUNTER — Encounter: Payer: Self-pay | Admitting: Unknown Physician Specialty

## 2017-08-06 ENCOUNTER — Other Ambulatory Visit: Payer: Self-pay | Admitting: Acute Care

## 2017-08-06 DIAGNOSIS — M542 Cervicalgia: Secondary | ICD-10-CM

## 2017-08-12 ENCOUNTER — Ambulatory Visit: Payer: PRIVATE HEALTH INSURANCE

## 2017-08-18 ENCOUNTER — Ambulatory Visit
Admission: RE | Admit: 2017-08-18 | Discharge: 2017-08-18 | Disposition: A | Discharge status: Home / Self Care | Payer: Medicare Other | Source: Ambulatory Visit | Attending: Acute Care | Admitting: Acute Care

## 2017-08-18 DIAGNOSIS — M47812 Spondylosis without myelopathy or radiculopathy, cervical region: Secondary | ICD-10-CM | POA: Insufficient documentation

## 2017-08-18 DIAGNOSIS — M2560 Stiffness of unspecified joint, not elsewhere classified: Secondary | ICD-10-CM | POA: Diagnosis not present

## 2017-08-18 DIAGNOSIS — I7774 Dissection of vertebral artery: Secondary | ICD-10-CM | POA: Insufficient documentation

## 2017-08-18 DIAGNOSIS — M542 Cervicalgia: Secondary | ICD-10-CM | POA: Insufficient documentation

## 2019-02-01 IMAGING — MR MR CERVICAL SPINE W/O CM
5 series · 35 of 48 positions shown · non-contrast
Comparison: None.

CLINICAL DATA: Stiffness in the neck.  No history of surgery.

EXAM:
MRI CERVICAL SPINE WITHOUT CONTRAST
TECHNIQUE: Multiplanar, multisequence MR imaging of the cervical spine was
performed. No intravenous contrast was administered.

[Series 3: T2 · sagittal · 3.0mm · 0.70mm/px · 8 of 15 slices shown (1 of 2)]
[im 1/15]
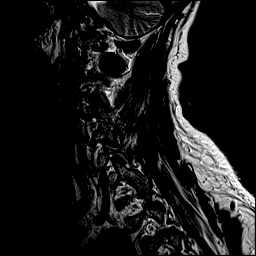
[im 3/15]
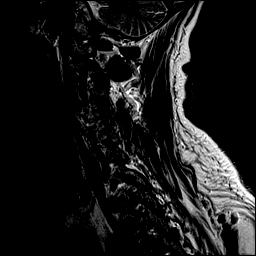
[im 5/15]
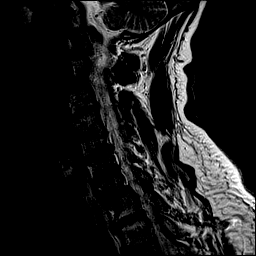
[im 7/15]
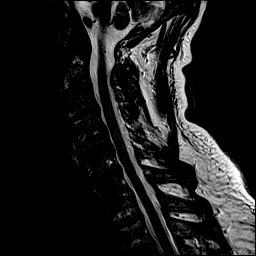
[im 9/15]
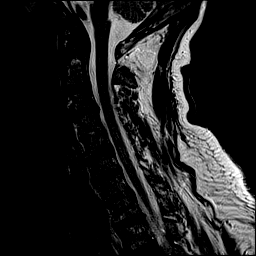
[im 11/15]
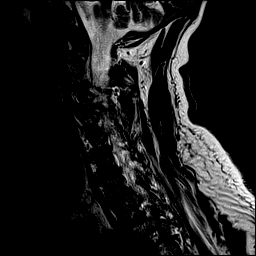
[im 13/15]
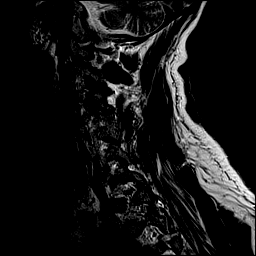
[im 15/15]
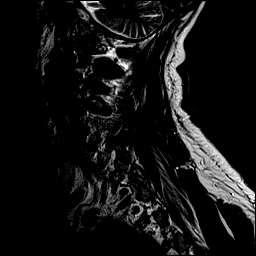

[Series 4: T1 · sagittal · 3.0mm · 0.70mm/px · 7 of 15 slices shown]
[im 1/15]
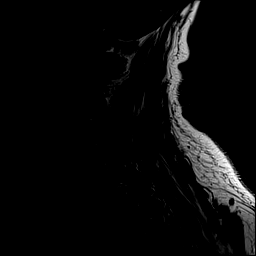
[im 3/15]
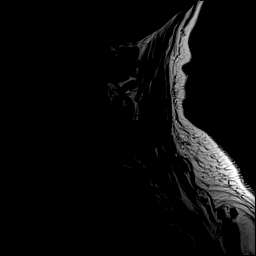
[im 5/15]
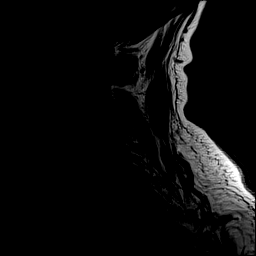
[im 8/15]
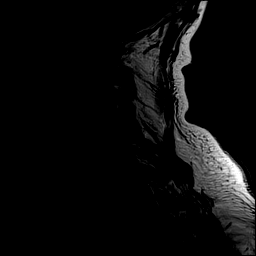
[im 10/15]
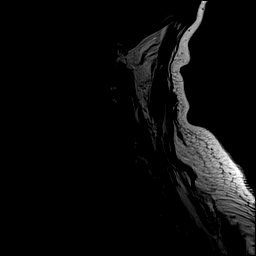
[im 12/15]
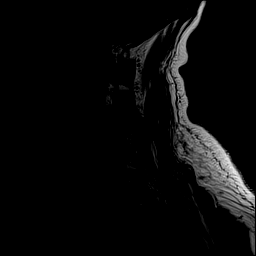
[im 15/15]
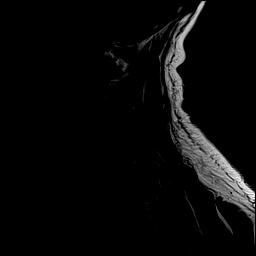

[Series 5: STIR · sagittal · 3.0mm · 0.35mm/px · 7 of 15 slices shown]
[im 1/15]
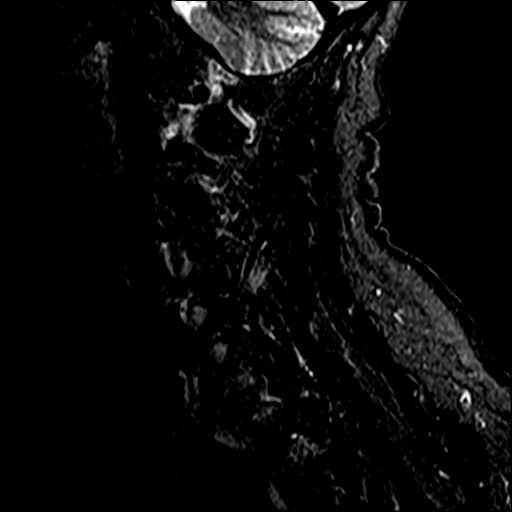
[im 3/15]
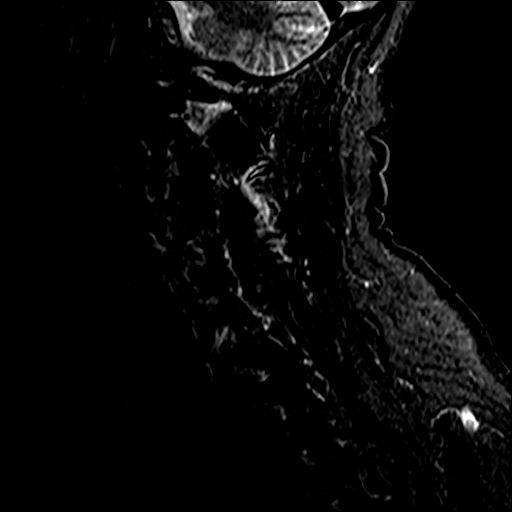
[im 5/15]
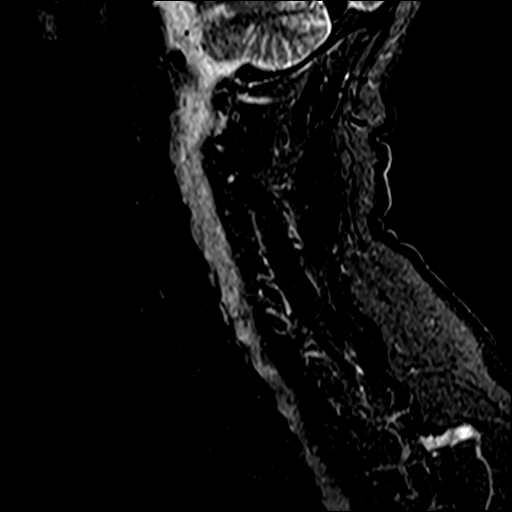
[im 8/15]
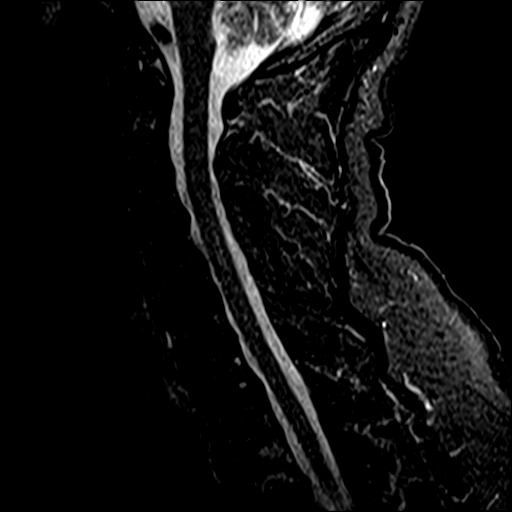
[im 10/15]
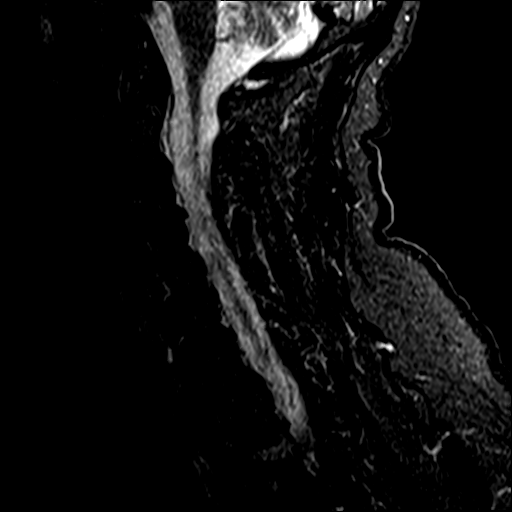
[im 12/15]
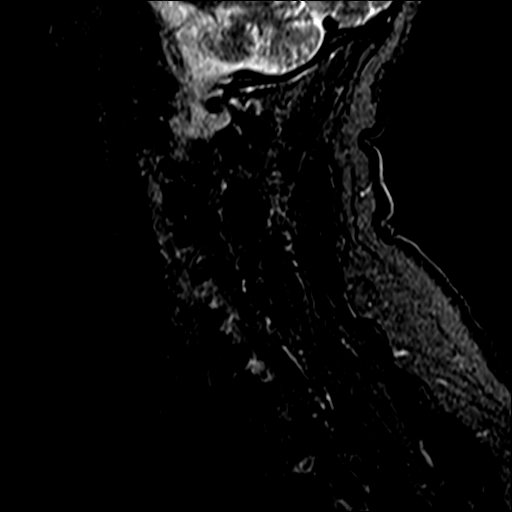
[im 15/15]
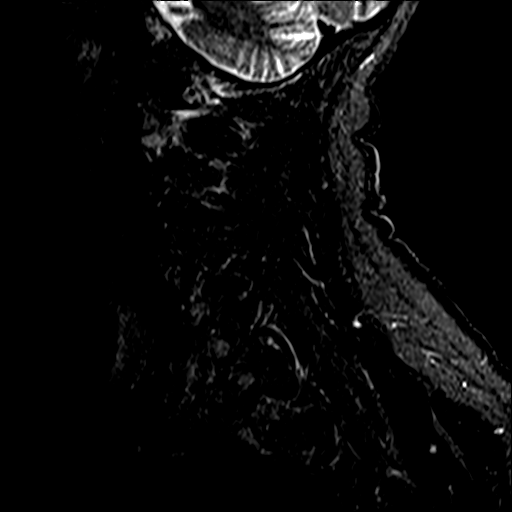

[Series 6: T2 · axial · 3.0mm · 0.70mm/px · z∈[-48,+45]mm · 9 of 26 slices shown (2 of 2)]
[im 1/26]
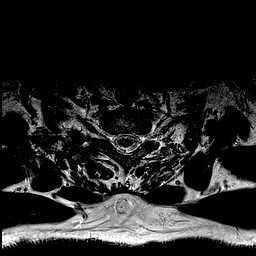
[im 5/26]
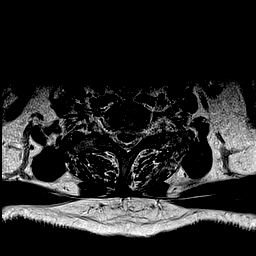
[im 9/26]
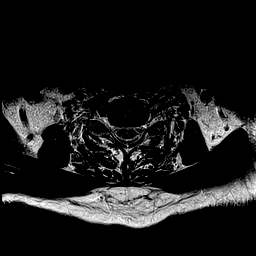
[im 11/26]
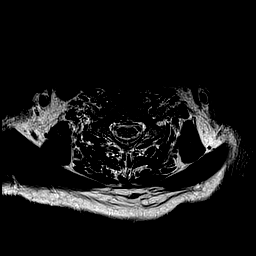
[im 13/26]
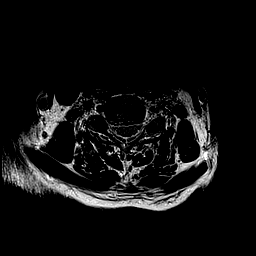
[im 15/26]
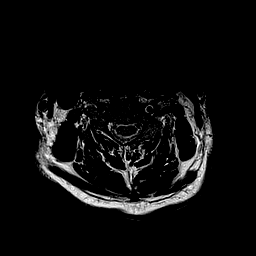
[im 17/26]
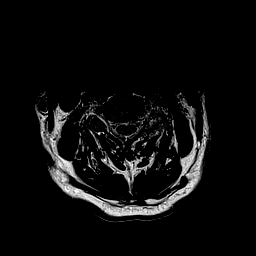
[im 21/26]
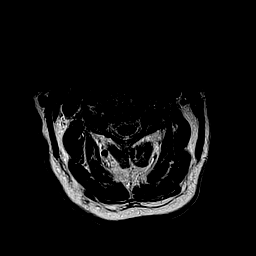
[im 26/26]
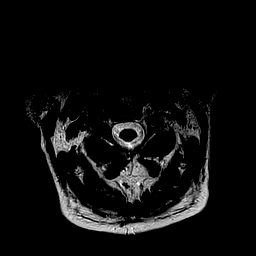

[Series 7: mpgr ax · axial · 3.0mm · 0.35mm/px · z∈[-48,-11]mm · 4 of 26 slices shown]
[im 1/26]
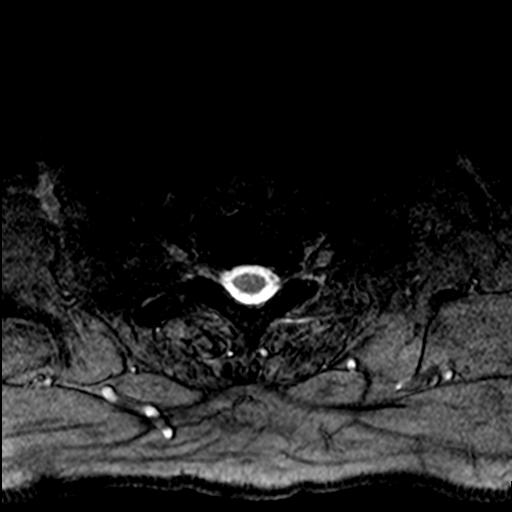
[im 5/26]
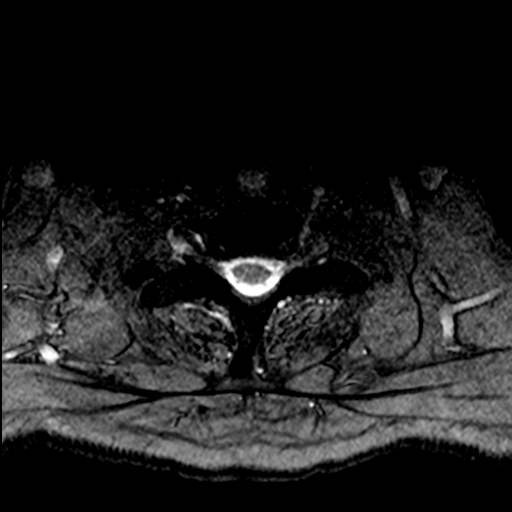
[im 9/26]
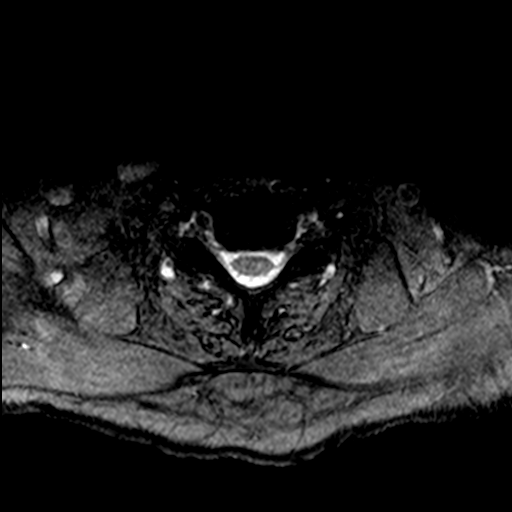
[im 11/26]
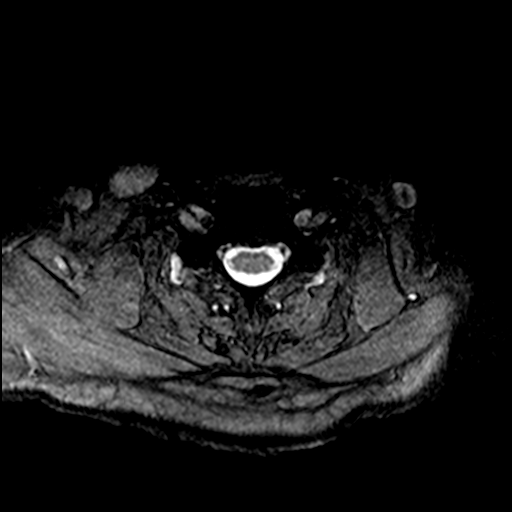

[35 of 48 positions shown; findings below may reference images not displayed]

FINDINGS: Alignment: Physiologic.

Vertebrae: No fracture, evidence of discitis, or bone lesion.

Cord: Normal signal and morphology.

Posterior Fossa, vertebral arteries, paraspinal tissues: Posterior
fossa demonstrates no focal abnormality. Vertebral artery flow voids
are maintained. Paraspinal soft tissues are unremarkable.

Disc levels:

Discs: Degenerative disc disease with mild disc height loss at C3-4
and C5-6. Osseous fusion across the C4-5 disc space.

C2-3: Mild broad-based disc bulge. No neural foraminal stenosis. No
central canal stenosis.

C3-4: Mild broad-based disc bulge. Mild bilateral facet arthropathy.
Severe right foraminal stenosis. Mild left foraminal stenosis. No
central canal stenosis.

C4-5: Interbody fusion. Moderate left facet arthropathy. No neural
foraminal stenosis. No central canal stenosis.

C5-6: No significant disc bulge. No neural foraminal stenosis. No
central canal stenosis.

C6-7: No significant disc bulge. No neural foraminal stenosis. No
central canal stenosis.

C7-T1: No significant disc bulge. No neural foraminal stenosis. No
central canal stenosis.

T1-2: Mild broad based disc bulge. No foraminal or central canal
stenosis.
IMPRESSION: 1. Mild cervical spine spondylosis as described above.
